# Patient Record
Sex: Female | Born: 1955 | Race: Black or African American | Hispanic: No | Marital: Married | Smoking: Never smoker
Health system: Southern US, Community
[De-identification: ages and names within clinical notes are randomized; demographics above are authoritative.]

## PROBLEM LIST (undated history)

## (undated) DIAGNOSIS — H409 Unspecified glaucoma: Secondary | ICD-10-CM

## (undated) DIAGNOSIS — M1990 Unspecified osteoarthritis, unspecified site: Secondary | ICD-10-CM

## (undated) DIAGNOSIS — B019 Varicella without complication: Secondary | ICD-10-CM

## (undated) DIAGNOSIS — R7303 Prediabetes: Secondary | ICD-10-CM

## (undated) DIAGNOSIS — R011 Cardiac murmur, unspecified: Secondary | ICD-10-CM

## (undated) DIAGNOSIS — I10 Essential (primary) hypertension: Secondary | ICD-10-CM

## (undated) DIAGNOSIS — M8580 Other specified disorders of bone density and structure, unspecified site: Secondary | ICD-10-CM

## (undated) DIAGNOSIS — J302 Other seasonal allergic rhinitis: Secondary | ICD-10-CM

## (undated) HISTORY — DX: Other seasonal allergic rhinitis: J30.2

## (undated) HISTORY — DX: Varicella without complication: B01.9

## (undated) HISTORY — DX: Unspecified glaucoma: H40.9

## (undated) HISTORY — DX: Essential (primary) hypertension: I10

## (undated) HISTORY — DX: Other specified disorders of bone density and structure, unspecified site: M85.80

## (undated) HISTORY — DX: Unspecified osteoarthritis, unspecified site: M19.90

## (undated) HISTORY — DX: Prediabetes: R73.03

## (undated) HISTORY — DX: Cardiac murmur, unspecified: R01.1

---
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
## 1998-04-29 ENCOUNTER — Other Ambulatory Visit: Admission: RE | Admit: 1998-04-29 | Discharge: 1998-04-29 | Payer: Self-pay | Admitting: Obstetrics and Gynecology

## 1998-06-17 ENCOUNTER — Ambulatory Visit (HOSPITAL_COMMUNITY): Admission: RE | Admit: 1998-06-17 | Discharge: 1998-06-17 | Payer: Self-pay | Admitting: Family Medicine

## 1998-10-15 ENCOUNTER — Other Ambulatory Visit: Admission: RE | Admit: 1998-10-15 | Discharge: 1998-10-15 | Payer: Self-pay | Admitting: Obstetrics and Gynecology

## 1999-05-20 ENCOUNTER — Other Ambulatory Visit: Admission: RE | Admit: 1999-05-20 | Discharge: 1999-05-20 | Payer: Self-pay | Admitting: Obstetrics and Gynecology

## 2000-06-06 ENCOUNTER — Other Ambulatory Visit: Admission: RE | Admit: 2000-06-06 | Discharge: 2000-06-06 | Payer: Self-pay | Admitting: Obstetrics and Gynecology

## 2001-05-15 ENCOUNTER — Encounter (INDEPENDENT_AMBULATORY_CARE_PROVIDER_SITE_OTHER): Payer: Self-pay | Admitting: Internal Medicine

## 2001-06-21 ENCOUNTER — Other Ambulatory Visit: Admission: RE | Admit: 2001-06-21 | Discharge: 2001-06-21 | Payer: Self-pay | Admitting: Obstetrics and Gynecology

## 2002-07-03 ENCOUNTER — Other Ambulatory Visit: Admission: RE | Admit: 2002-07-03 | Discharge: 2002-07-03 | Payer: Self-pay | Admitting: Obstetrics and Gynecology

## 2003-08-13 ENCOUNTER — Other Ambulatory Visit: Admission: RE | Admit: 2003-08-13 | Discharge: 2003-08-13 | Payer: Self-pay | Admitting: Obstetrics and Gynecology

## 2004-09-01 ENCOUNTER — Other Ambulatory Visit: Admission: RE | Admit: 2004-09-01 | Discharge: 2004-09-01 | Payer: Self-pay | Admitting: Obstetrics and Gynecology

## 2004-10-15 ENCOUNTER — Ambulatory Visit: Payer: Self-pay | Admitting: Family Medicine

## 2005-10-05 ENCOUNTER — Other Ambulatory Visit: Admission: RE | Admit: 2005-10-05 | Discharge: 2005-10-05 | Payer: Self-pay | Admitting: Obstetrics and Gynecology

## 2005-11-11 ENCOUNTER — Ambulatory Visit: Payer: Self-pay | Admitting: Family Medicine

## 2006-01-05 ENCOUNTER — Ambulatory Visit: Payer: Self-pay | Admitting: Family Medicine

## 2006-10-13 ENCOUNTER — Ambulatory Visit: Payer: Self-pay | Admitting: Family Medicine

## 2006-11-10 ENCOUNTER — Ambulatory Visit: Payer: Self-pay | Admitting: Gastroenterology

## 2006-12-02 ENCOUNTER — Ambulatory Visit: Payer: Self-pay | Admitting: Gastroenterology

## 2006-12-02 HISTORY — PX: COLONOSCOPY: SHX174

## 2007-10-25 ENCOUNTER — Encounter (INDEPENDENT_AMBULATORY_CARE_PROVIDER_SITE_OTHER): Payer: Self-pay | Admitting: Internal Medicine

## 2007-10-25 DIAGNOSIS — O99810 Abnormal glucose complicating pregnancy: Secondary | ICD-10-CM | POA: Insufficient documentation

## 2007-11-01 ENCOUNTER — Encounter (INDEPENDENT_AMBULATORY_CARE_PROVIDER_SITE_OTHER): Payer: Self-pay | Admitting: Internal Medicine

## 2007-11-01 ENCOUNTER — Ambulatory Visit: Payer: Self-pay | Admitting: Family Medicine

## 2007-11-03 LAB — CONVERTED CEMR LAB
CO2: 24 meq/L (ref 19–32)
Calcium: 9.3 mg/dL (ref 8.4–10.5)
Glucose, Bld: 92 mg/dL (ref 70–99)
Potassium: 4.3 meq/L (ref 3.5–5.3)
Sodium: 143 meq/L (ref 135–145)
Total CHOL/HDL Ratio: 3
VLDL: 11 mg/dL (ref 0–40)

## 2007-11-06 LAB — CONVERTED CEMR LAB: Vit D, 1,25-Dihydroxy: 53 (ref 30–89)

## 2008-11-01 ENCOUNTER — Ambulatory Visit: Payer: Self-pay | Admitting: Family Medicine

## 2008-11-01 DIAGNOSIS — J069 Acute upper respiratory infection, unspecified: Secondary | ICD-10-CM | POA: Insufficient documentation

## 2008-11-05 LAB — CONVERTED CEMR LAB
Basophils Absolute: 0.1 10*3/uL (ref 0.0–0.1)
Basophils Relative: 0.7 % (ref 0.0–3.0)
Calcium: 9 mg/dL (ref 8.4–10.5)
Cholesterol: 175 mg/dL (ref 0–200)
Creatinine, Ser: 0.7 mg/dL (ref 0.4–1.2)
Eosinophils Absolute: 0.1 10*3/uL (ref 0.0–0.7)
GFR calc non Af Amer: 93 mL/min
HCT: 36.9 % (ref 36.0–46.0)
Hemoglobin: 12.7 g/dL (ref 12.0–15.0)
MCHC: 34.3 g/dL (ref 30.0–36.0)
MCV: 89.8 fL (ref 78.0–100.0)
Neutro Abs: 6.2 10*3/uL (ref 1.4–7.7)
RBC: 4.11 M/uL (ref 3.87–5.11)

## 2009-11-12 ENCOUNTER — Telehealth (INDEPENDENT_AMBULATORY_CARE_PROVIDER_SITE_OTHER): Payer: Self-pay | Admitting: Internal Medicine

## 2011-02-24 ENCOUNTER — Other Ambulatory Visit: Payer: Self-pay | Admitting: Obstetrics and Gynecology

## 2011-02-24 DIAGNOSIS — N6310 Unspecified lump in the right breast, unspecified quadrant: Secondary | ICD-10-CM

## 2011-02-26 ENCOUNTER — Ambulatory Visit
Admission: RE | Admit: 2011-02-26 | Discharge: 2011-02-26 | Disposition: A | Discharge status: Home / Self Care | Payer: PRIVATE HEALTH INSURANCE | Source: Ambulatory Visit | Attending: Obstetrics and Gynecology | Admitting: Obstetrics and Gynecology

## 2011-02-26 DIAGNOSIS — N6310 Unspecified lump in the right breast, unspecified quadrant: Secondary | ICD-10-CM

## 2012-04-19 ENCOUNTER — Other Ambulatory Visit: Payer: Self-pay | Admitting: Obstetrics and Gynecology

## 2012-04-19 DIAGNOSIS — R928 Other abnormal and inconclusive findings on diagnostic imaging of breast: Secondary | ICD-10-CM

## 2012-05-01 ENCOUNTER — Ambulatory Visit
Admission: RE | Admit: 2012-05-01 | Discharge: 2012-05-01 | Disposition: A | Discharge status: Home / Self Care | Payer: PRIVATE HEALTH INSURANCE | Source: Ambulatory Visit | Attending: Obstetrics and Gynecology | Admitting: Obstetrics and Gynecology

## 2012-05-01 DIAGNOSIS — R928 Other abnormal and inconclusive findings on diagnostic imaging of breast: Secondary | ICD-10-CM

## 2015-08-20 ENCOUNTER — Ambulatory Visit
Admission: RE | Admit: 2015-08-20 | Discharge: 2015-08-20 | Disposition: A | Discharge status: Home / Self Care | Payer: PRIVATE HEALTH INSURANCE | Source: Ambulatory Visit | Attending: Primary Care | Admitting: Primary Care

## 2015-08-20 ENCOUNTER — Ambulatory Visit (INDEPENDENT_AMBULATORY_CARE_PROVIDER_SITE_OTHER)
Admission: RE | Admit: 2015-08-20 | Discharge: 2015-08-20 | Disposition: A | Discharge status: Home / Self Care | Payer: PRIVATE HEALTH INSURANCE | Source: Ambulatory Visit | Attending: Primary Care | Admitting: Primary Care

## 2015-08-20 ENCOUNTER — Encounter (INDEPENDENT_AMBULATORY_CARE_PROVIDER_SITE_OTHER): Payer: Self-pay

## 2015-08-20 ENCOUNTER — Encounter: Payer: Self-pay | Admitting: Primary Care

## 2015-08-20 ENCOUNTER — Ambulatory Visit (INDEPENDENT_AMBULATORY_CARE_PROVIDER_SITE_OTHER): Payer: PRIVATE HEALTH INSURANCE | Admitting: Primary Care

## 2015-08-20 VITALS — BP 148/102 | HR 78 | Temp 97.8°F | Ht 63.0 in | Wt 179.1 lb

## 2015-08-20 DIAGNOSIS — M2540 Effusion, unspecified joint: Secondary | ICD-10-CM | POA: Diagnosis not present

## 2015-08-20 DIAGNOSIS — H409 Unspecified glaucoma: Secondary | ICD-10-CM | POA: Insufficient documentation

## 2015-08-20 DIAGNOSIS — M2550 Pain in unspecified joint: Secondary | ICD-10-CM | POA: Diagnosis not present

## 2015-08-20 DIAGNOSIS — M25561 Pain in right knee: Secondary | ICD-10-CM | POA: Diagnosis not present

## 2015-08-20 DIAGNOSIS — M25562 Pain in left knee: Secondary | ICD-10-CM

## 2015-08-20 DIAGNOSIS — R030 Elevated blood-pressure reading, without diagnosis of hypertension: Secondary | ICD-10-CM

## 2015-08-20 DIAGNOSIS — I10 Essential (primary) hypertension: Secondary | ICD-10-CM | POA: Insufficient documentation

## 2015-08-20 DIAGNOSIS — IMO0001 Reserved for inherently not codable concepts without codable children: Secondary | ICD-10-CM

## 2015-08-20 LAB — RHEUMATOID FACTOR

## 2015-08-20 LAB — C-REACTIVE PROTEIN: CRP: 0.5 mg/dL (ref 0.5–20.0)

## 2015-08-20 LAB — BASIC METABOLIC PANEL
BUN: 11 mg/dL (ref 6–23)
CHLORIDE: 106 meq/L (ref 96–112)
CO2: 27 meq/L (ref 19–32)
CREATININE: 0.69 mg/dL (ref 0.40–1.20)
Calcium: 9.1 mg/dL (ref 8.4–10.5)
GFR: 112.01 mL/min (ref >60.00)
Glucose, Bld: 92 mg/dL (ref 70–99)
POTASSIUM: 3.9 meq/L (ref 3.5–5.1)
SODIUM: 140 meq/L (ref 135–145)

## 2015-08-20 MED ORDER — MELOXICAM 15 MG PO TABS
15.0000 mg | ORAL_TABLET | Freq: Every day | ORAL | Status: DC | PRN
Start: 2015-08-20 — End: 2017-08-31

## 2015-08-20 NOTE — Progress Notes (Signed)
Pre visit review using our clinic review tool, if applicable. No additional management support is needed unless otherwise documented below in the visit note. 

## 2015-08-20 NOTE — Patient Instructions (Signed)
Complete xray(s) prior to leaving today. I will contact you regarding your results.  Complete lab work prior to leaving today. I will notify you of your results.  Please schedule a physical with me in the next 1-2 months. You will also schedule a lab only appointment one week prior. We will discuss your lab results during your physical.  Check your blood pressure several times weekly for the next several weeks. Record your readings and bring them to your next appointment. Ensure you have rested for at least 30 minutes prior to measuring.  It was a pleasure to meet you today! Please don't hesitate to call me with any questions. Welcome to [facility]!  Arthritis Arthritis is a term that is commonly used to refer to joint pain or joint disease. There are more than 100 types of arthritis. CAUSES The most common cause of this condition is wear and tear of a joint. Other causes include:  Gout.  Inflammation of a joint.  An infection of a joint.  Sprains and other injuries near the joint.  A drug reaction or allergic reaction. In some cases, the cause may not be known. SYMPTOMS The main symptom of this condition is pain in the joint with movement. Other symptoms include:  Redness, swelling, or stiffness at a joint.  Warmth coming from the joint.  Fever.  Overall feeling of illness. DIAGNOSIS This condition may be diagnosed with a physical exam and tests, including:  Blood tests.  Urine tests.  Imaging tests, such as MRI, X-rays, or a CT scan. Sometimes, fluid is removed from a joint for testing. TREATMENT Treatment for this condition may involve:  Treatment of the cause, if it is known.  Rest.  Raising (elevating) the joint.  Applying cold or hot packs to the joint.  Medicines to improve symptoms and reduce inflammation.  Injections of a steroid such as cortisone into the joint to help reduce pain and inflammation. Depending on the cause of your arthritis, you may  need to make lifestyle changes to reduce stress on your joint. These changes may include exercising more and losing weight. HOME CARE INSTRUCTIONS Medicines  Take over-the-counter and prescription medicines only as told by your health care provider.  Do not take aspirin to relieve pain if gout is suspected. Activities  Rest your joint if told by your health care provider. Rest is important when your disease is active and your joint feels painful, swollen, or stiff.  Avoid activities that make the pain worse. It is important to balance activity with rest.  Exercise your joint regularly with range-of-motion exercises as told by your health care provider. Try doing low-impact exercise, such as:  Swimming.  Water aerobics.  Biking.  Walking. Joint Care  If your joint is swollen, keep it elevated if told by your health care provider.  If your joint feels stiff in the morning, try taking a warm shower.  If directed, apply heat to the joint. If you have diabetes, do not apply heat without permission from your health care provider.  Put a towel between the joint and the hot pack or heating pad.  Leave the heat on the area for 20-30 minutes.  If directed, apply ice to the joint:  Put ice in a plastic bag.  Place a towel between your skin and the bag.  Leave the ice on for 20 minutes, 2-3 times per day.  Keep all follow-up visits as told by your health care provider. This is important. SEEK MEDICAL CARE IF:  The pain gets worse.  You have a fever. SEEK IMMEDIATE MEDICAL CARE IF:  You develop severe joint pain, swelling, or redness.  Many joints become painful and swollen.  You develop severe back pain.  You develop severe weakness in your leg.  You cannot control your bladder or bowels.   This information is not intended to replace advice given to you by your health care provider. Make sure you discuss any questions you have with your health care provider.    Document Released: 12/09/2004 Document Revised: 07/23/2015 Document Reviewed: 01/27/2015 Elsevier Interactive Patient Education [employer].

## 2015-08-20 NOTE — Assessment & Plan Note (Signed)
Diagnosed 5 years ago. Managed on lumigan and timolol drops per [facility]. She follows with them semi-annually.

## 2015-08-20 NOTE — Progress Notes (Signed)
Subjective:    Patient ID: Madison [name], female    DOB: 07-18-1956, 59 y.o.   MRN: [id number]  HPI  Madison [name] is a 59 year old female who presents today to establish care and discuss the problems mentioned below. Will obtain old records. She's been using the [facility] for her care during the past several years. Her last full physical was several years ago.  1) Elevated Blood Pressure: Elevated in the clinic today at 148/102. She reports elevated readings in the the past (several years ago). She has not checked her BP recently. Denies headaches, chest pain, SOB.  2) Arthritis: Present to bilateral knees, hands, elbows. Her pain is worse in the morning when she first wakes up, and going down steps. She continues to experience pain  throughout the day. She's been taking ibuprofen intermittently with improvement. She wears orthotic supports and knee braces. She will notice joint swelling to her left ring finger over the past several months. Her knee pain has been present for at least one year.   3) Glaucoma: Diagnosed 5 years ago Currently managed on Lumigan 0.01% and timolol 0.25% solution. She is managed at [facility] every 6 months.   Review of Systems  Constitutional: Negative for unexpected weight change.  HENT: Negative for rhinorrhea.   Respiratory: Negative for cough and shortness of breath.   Cardiovascular: Negative for chest pain.  Gastrointestinal: Negative for diarrhea and constipation.  Genitourinary: Negative for difficulty urinating.  Musculoskeletal: Positive for arthralgias.  Skin: Negative for rash.  Allergic/Immunologic: Positive for environmental allergies.  Neurological: Negative for dizziness, numbness and headaches.  Psychiatric/Behavioral:       Denies concerns for anxieyt or depression       Past Medical History  Diagnosis Date  . Arthritis   . Chicken pox   . Glaucoma   . Allergy   . Heart murmur   . Hypertension   .  Osteopenia     Social History   Social History  . Marital Status: Married    Spouse Name: N/A  . Number of Children: N/A  . Years of Education: N/A   Occupational History  . Not on file.   Social History Main Topics  . Smoking status: Never Smoker   . Smokeless tobacco: Not on file  . Alcohol Use: No  . Drug Use: Not on file  . Sexual Activity: Not on file   Other Topics Concern  . Not on file   Social History Narrative  . No narrative on file    Past Surgical History  Procedure Laterality Date  . Tonsillectomy and adenoidectomy  1961    Family History  Problem Relation Age of Onset  . Arthritis Mother   . Hyperlipidemia Mother   . Heart disease Mother   . Stroke Mother   . Hypertension Mother   . Diabetes Father   . Diabetes Brother   . Arthritis Maternal Grandmother   . Diabetes Paternal Grandmother     No Known Allergies  No current outpatient prescriptions on file prior to visit.   No current facility-administered medications on file prior to visit.    BP 148/102 mmHg  Pulse 78  Temp(Src) 97.8 F (36.6 C) (Oral)  Ht 5\' 3"  (1.6 m)  Wt 179 lb 1.9 oz (81.248 kg)  BMI 31.74 kg/m2  SpO2 97%    Objective:   Physical Exam  Constitutional: She is oriented to person, place, and time. She appears well-nourished.  Neck:  Neck supple.  Cardiovascular: Normal rate and regular rhythm.   Pulmonary/Chest: Effort normal and breath sounds normal.  Musculoskeletal:       Right elbow: Tenderness found.       Right knee: She exhibits decreased range of motion. She exhibits no deformity and normal alignment. No tenderness found.       Left knee: She exhibits decreased range of motion. She exhibits no deformity and normal alignment. No tenderness found.  Stiffness to bilateral patella.  Neurological: She is alert and oriented to person, place, and time.  Skin: Skin is warm and dry.  Psychiatric: She has a normal mood and affect.          Assessment &  Plan:

## 2015-08-20 NOTE — Assessment & Plan Note (Signed)
Elevated during exam today. Endorses history several years ago for elevated readings. Currently not managed on medication. Discussed improvement through weight loss. She is to monitor her readings at home. Will continue to monitor.

## 2015-08-20 NOTE — Assessment & Plan Note (Addendum)
Suspect osteoarthritis as pain is worse in the morning with stiffness bilaterally. Exam with slight decrease in ROM due to stiffness. Will obtain xrays today. Will obtain blood work to rule out other inflammatory disorders. RX for Meloxicam to use PRN due to moderate intensity of pain.

## 2015-08-21 LAB — ANA: ANA: NEGATIVE

## 2016-05-13 ENCOUNTER — Other Ambulatory Visit: Payer: Self-pay | Admitting: Primary Care

## 2016-05-13 DIAGNOSIS — Z0000 Encounter for general adult medical examination without abnormal findings: Secondary | ICD-10-CM

## 2016-05-20 ENCOUNTER — Other Ambulatory Visit (INDEPENDENT_AMBULATORY_CARE_PROVIDER_SITE_OTHER): Payer: Managed Care, Other (non HMO)

## 2016-05-20 DIAGNOSIS — Z0000 Encounter for general adult medical examination without abnormal findings: Secondary | ICD-10-CM | POA: Diagnosis not present

## 2016-05-20 LAB — COMPREHENSIVE METABOLIC PANEL
ALT: 11 U/L (ref 0–35)
AST: 16 U/L (ref 0–37)
Albumin: 4.3 g/dL (ref 3.5–5.2)
Alkaline Phosphatase: 55 U/L (ref 39–117)
BUN: 12 mg/dL (ref 6–23)
CHLORIDE: 109 meq/L (ref 96–112)
CO2: 26 meq/L (ref 19–32)
Calcium: 9.5 mg/dL (ref 8.4–10.5)
Creatinine, Ser: 0.74 mg/dL (ref 0.40–1.20)
GFR: 103.06 mL/min (ref >60.00)
GLUCOSE: 103 mg/dL — AB (ref 70–99)
POTASSIUM: 4 meq/L (ref 3.5–5.1)
SODIUM: 141 meq/L (ref 135–145)
Total Bilirubin: 0.4 mg/dL (ref 0.2–1.2)
Total Protein: 7.4 g/dL (ref 6.0–8.3)

## 2016-05-20 LAB — LIPID PANEL
Cholesterol: 213 mg/dL — ABNORMAL HIGH (ref 0–200)
HDL: 56.6 mg/dL (ref >39.00)
LDL CALC: 143 mg/dL — AB (ref 0–99)
NONHDL: 156.56
Total CHOL/HDL Ratio: 4
Triglycerides: 68 mg/dL (ref 0.0–149.0)
VLDL: 13.6 mg/dL (ref 0.0–40.0)

## 2016-05-20 LAB — HEMOGLOBIN A1C: Hgb A1c MFr Bld: 5.6 % (ref 4.6–6.5)

## 2016-05-20 LAB — VITAMIN D 25 HYDROXY (VIT D DEFICIENCY, FRACTURES): VITD: 30.34 ng/mL (ref 30.00–100.00)

## 2016-05-25 ENCOUNTER — Ambulatory Visit (INDEPENDENT_AMBULATORY_CARE_PROVIDER_SITE_OTHER): Payer: Managed Care, Other (non HMO) | Admitting: Primary Care

## 2016-05-25 ENCOUNTER — Encounter: Payer: Self-pay | Admitting: Primary Care

## 2016-05-25 ENCOUNTER — Other Ambulatory Visit (HOSPITAL_COMMUNITY)
Admission: RE | Admit: 2016-05-25 | Discharge: 2016-05-25 | Disposition: A | Discharge status: Home / Self Care | Payer: Managed Care, Other (non HMO) | Source: Ambulatory Visit | Attending: Primary Care | Admitting: Primary Care

## 2016-05-25 VITALS — BP 118/82 | HR 60 | Temp 98.3°F | Ht 63.0 in | Wt 165.4 lb

## 2016-05-25 DIAGNOSIS — Z01419 Encounter for gynecological examination (general) (routine) without abnormal findings: Secondary | ICD-10-CM | POA: Insufficient documentation

## 2016-05-25 DIAGNOSIS — M8580 Other specified disorders of bone density and structure, unspecified site: Secondary | ICD-10-CM | POA: Insufficient documentation

## 2016-05-25 DIAGNOSIS — Z1239 Encounter for other screening for malignant neoplasm of breast: Secondary | ICD-10-CM | POA: Diagnosis not present

## 2016-05-25 DIAGNOSIS — M25561 Pain in right knee: Secondary | ICD-10-CM

## 2016-05-25 DIAGNOSIS — M25562 Pain in left knee: Secondary | ICD-10-CM

## 2016-05-25 DIAGNOSIS — R030 Elevated blood-pressure reading, without diagnosis of hypertension: Secondary | ICD-10-CM | POA: Diagnosis not present

## 2016-05-25 DIAGNOSIS — E785 Hyperlipidemia, unspecified: Secondary | ICD-10-CM

## 2016-05-25 DIAGNOSIS — Z124 Encounter for screening for malignant neoplasm of cervix: Secondary | ICD-10-CM

## 2016-05-25 DIAGNOSIS — Z0000 Encounter for general adult medical examination without abnormal findings: Secondary | ICD-10-CM

## 2016-05-25 DIAGNOSIS — IMO0001 Reserved for inherently not codable concepts without codable children: Secondary | ICD-10-CM

## 2016-05-25 DIAGNOSIS — Z1151 Encounter for screening for human papillomavirus (HPV): Secondary | ICD-10-CM | POA: Diagnosis not present

## 2016-05-25 DIAGNOSIS — Z23 Encounter for immunization: Secondary | ICD-10-CM

## 2016-05-25 DIAGNOSIS — H409 Unspecified glaucoma: Secondary | ICD-10-CM

## 2016-05-25 NOTE — Progress Notes (Signed)
Pre visit review using our clinic review tool, if applicable. No additional management support is needed unless otherwise documented below in the visit note. 

## 2016-05-25 NOTE — Assessment & Plan Note (Signed)
Managed by opthalmology semi-annually.

## 2016-05-25 NOTE — Assessment & Plan Note (Signed)
Continues to experience discomfort, some improvement with Meloxicam. She is interested in injections, discussed evaluation with Dr. Lorelei Pont would be beneficial, she will schedule.

## 2016-05-25 NOTE — Patient Instructions (Signed)
You will be contacted regarding your mammogram and bone density testing. Please let us know if you have not heard back within one week.   Start Vitamin D 1000 unit capsules. Take 1 capsule by mouth every day. Take this in addition to your calcium with vitamin D.  Schedule an appointment with Dr. Lorelei Pont for evaluation of your knee pain.  Congratulations on your weight loss and reduction in blood sugar levels!! This is a huge accomplishment!  You were provided with a Tetanus vaccination today which will cover you for 10 years. You will be due for your Shingles vaccination at age 60.  Your cholesterol is slightly elevated. Continue to make improvements in your diet and start exercising.  Ensure you are consuming 64 ounces of water daily.  You should be getting 1 hour of moderate intensity exercise 5 days weekly.  Follow up in 1 year for repeat physical or sooner if needed.  It was a pleasure to see you today!

## 2016-05-25 NOTE — Assessment & Plan Note (Signed)
Stable in the clinic today, home readings of 130/80's.

## 2016-05-25 NOTE — Addendum Note (Signed)
Addended by: Jacqualin Combes on: 05/25/2016 09:24 AM   Modules accepted: Orders, SmartSet

## 2016-05-25 NOTE — Assessment & Plan Note (Signed)
Td due, completed today. Shingles due next year. Mammogram, bone density, pap due. All ordered/completed today. She has lost 10+ pounds through improvements in diet, commended her on this accomplishment. Labs with much improved A1C from 6.5 to 5.6. Also with slight hyperlipidemia, discussed to incorporate exercise. Exam unremarkable.  Follow up in 1 year for repeat physical.

## 2016-05-25 NOTE — Assessment & Plan Note (Signed)
Bone density scanning due, ordered today. Previously managed on Fosamax, stopped taking due to possible side effects after reading the Internet. Discussed calcium with vitamin D treatment. Will continue to monitor.

## 2016-05-25 NOTE — Progress Notes (Signed)
Subjective:    Patient ID: Madison [name], female    DOB: 1956/02/21, 60 y.o.   MRN: [id number]  HPI  Madison [name] is a 60 year old female who presents today for complete physical.  Immunizations: -Tetanus: Completed in 2006 -Influenza: Did complete last Fall 2016 -Pneumonia: Completed Pneumovax in 2006  Diet: She endorses a healthy diet. Breakfast: Fiber one or granola bar Lunch: Research officer, trade [employer]: Vegetable, salad, protein Snacks: Nuts, cheese and crackers Desserts: 4 times weekly Beverages: Diet soda, some water intake  Exercise: She does not currently exercise, sometimes walks 2-3 times weekly. Eye exam: Completed in 2016, glaucoma. Dental exam: Completes semi-annually Colonoscopy: Completed at age 54. Due in 2018. Pap Smear: Completed 3 years ago. Due today. Mammogram: Completed 2 years ago. Due.  Wt Readings from Last 3 Encounters:  05/25/16 165 lb 6.4 oz (75.025 kg)  08/20/15 179 lb 1.9 oz (81.248 kg)  11/01/08 164 lb (74.39 kg)      Review of Systems  Constitutional: Negative for unexpected weight change.  HENT: Negative for rhinorrhea.   Respiratory: Negative for cough and shortness of breath.   Cardiovascular: Negative for chest pain.  Gastrointestinal: Negative for diarrhea and constipation.  Genitourinary: Negative for difficulty urinating and menstrual problem.  Musculoskeletal: Positive for arthralgias. Negative for myalgias.       Bilateral knees and left elbow  Skin: Negative for rash.  Allergic/Immunologic: Positive for environmental allergies.  Neurological: Negative for dizziness, numbness and headaches.  Psychiatric/Behavioral:       Denies concerns for anxiety or depression       Past Medical History  Diagnosis Date  . Arthritis   . Chicken pox   . Glaucoma   . Allergy   . Heart murmur   . Hypertension   . Osteopenia      Social History   Social History  . Marital Status: Married    Spouse Name: N/A  . Number of Children:  N/A  . Years of Education: N/A   Occupational History  . Not on file.   Social History Main Topics  . Smoking status: Never Smoker   . Smokeless tobacco: Not on file  . Alcohol Use: No  . Drug Use: Not on file  . Sexual Activity: Not on file   Other Topics Concern  . Not on file   Social History Narrative   Married.   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    Past Surgical History  Procedure Laterality Date  . Tonsillectomy and adenoidectomy  1961    Family History  Problem Relation Age of Onset  . Arthritis Mother   . Hyperlipidemia Mother   . Heart disease Mother   . Stroke Mother   . Hypertension Mother   . Diabetes Father   . Diabetes Brother   . Arthritis Maternal Grandmother   . Diabetes Paternal Grandmother     No Known Allergies  Current Outpatient Prescriptions on File Prior to Visit  Medication Sig Dispense Refill  . cetirizine (ZYRTEC) 10 MG tablet Take 10 mg by mouth daily.    Marland Kitchen LUMIGAN 0.01 % SOLN INSTILL ONE DROP INTO BOTH EYES AT BEDTIME  6  . timolol (TIMOPTIC) 0.25 % ophthalmic solution USE 1 DROP(S) IN BOTH EYES ONCE IN THE MORNING FOR 30 DAYS  6  . meloxicam (MOBIC) 15 MG tablet Take 1 tablet (15 mg total) by mouth daily as needed for pain. (Patient  not taking: Reported on 05/25/2016) 30 tablet 1   No current facility-administered medications on file prior to visit.    BP 118/82 mmHg  Pulse 60  Temp(Src) 98.3 F (36.8 C) (Oral)  Ht 5\' 3"  (1.6 m)  Wt 165 lb 6.4 oz (75.025 kg)  BMI 29.31 kg/m2  SpO2 98%    Objective:   Physical Exam  Constitutional: She is oriented to person, place, and time. She appears well-nourished.  HENT:  Right Ear: Tympanic membrane and ear canal normal.  Left Ear: Tympanic membrane and ear canal normal.  Nose: Nose normal.  Mouth/Throat: Oropharynx is clear and moist.  Eyes: Conjunctivae and EOM are normal. Pupils are equal, round, and reactive to  light.  Neck: Neck supple. No thyromegaly present.  Cardiovascular: Normal rate and regular rhythm.   No murmur heard. Pulmonary/Chest: Effort normal and breath sounds normal. She has no rales.  Abdominal: Soft. Bowel sounds are normal. There is no tenderness.  Genitourinary: There is no lesion on the right labia. There is no lesion on the left labia. Cervix exhibits no motion tenderness and no discharge. Right adnexum displays no tenderness. Left adnexum displays no tenderness. No tenderness in the vagina. No vaginal discharge found.  Musculoskeletal: Normal range of motion.  Lymphadenopathy:    She has no cervical adenopathy.  Neurological: She is alert and oriented to person, place, and time. She has normal reflexes. No cranial nerve deficit.  Skin: Skin is warm and dry. No rash noted.  Psychiatric: She has a normal mood and affect.          Assessment & Plan:

## 2016-05-25 NOTE — Assessment & Plan Note (Signed)
TC of 213, LDL of 143, Family history of hyperlipidemia. Recent weight loss of 10+ pounds through healthy lifestyle changes. Will continue to monitor lipids.

## 2016-05-27 ENCOUNTER — Encounter: Payer: Self-pay | Admitting: *Deleted

## 2016-05-27 LAB — CYTOLOGY - PAP

## 2016-06-03 ENCOUNTER — Encounter: Payer: Self-pay | Admitting: Family Medicine

## 2016-06-03 ENCOUNTER — Ambulatory Visit (INDEPENDENT_AMBULATORY_CARE_PROVIDER_SITE_OTHER): Payer: Managed Care, Other (non HMO) | Admitting: Family Medicine

## 2016-06-03 VITALS — BP 110/72 | HR 63 | Temp 98.3°F | Ht 63.0 in | Wt 164.5 lb

## 2016-06-03 DIAGNOSIS — M170 Bilateral primary osteoarthritis of knee: Secondary | ICD-10-CM | POA: Diagnosis not present

## 2016-06-03 DIAGNOSIS — M2240 Chondromalacia patellae, unspecified knee: Secondary | ICD-10-CM | POA: Diagnosis not present

## 2016-06-03 NOTE — Progress Notes (Signed)
Dr. Frederico Hamman T. Marin Milley, MD, [facility] [place], [address] Phone: [phone] Fax: [phone]  06/03/2016  Patient: Madison [name], MRN: [id number], DOB: 1956-09-24, 60 y.o.  Primary Physician:  Sheral Flow, NP   Chief Complaint  Patient presents with  . Knee Pain    Bilateral   Subjective:   This 60 y.o. female patient presents with 2 day h/o B sided knee pain after no injury. No audible pop was heard. The patient has not had an effusion. No symptomatic giving-way. No mechanical clicking. Joint has not locked up. Patient has been able to walk but is limping. The patient does have pain going up and down stairs or rising from a seated position.   R sided knee pain for a couple of years.  Works for [employer] in [place]. 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   Meetings, computers, etc with some walking.  Not too much exercise.   Meloxicam prn. Occ tylenol, advil, or alleve No ice.   Pain location: anterior Current physical activity: minimal Prior Knee Surgery: none Current pain meds: rare Mobic Bracing: none Occupation or school level: as above  The PMH, PSH, Social History, Family History, Medications, and allergies have been reviewed in [facility], and have been updated if relevant.  Patient Active Problem List   Diagnosis Date Noted  . Preventative health care 05/25/2016  . Hyperlipidemia 05/25/2016  . Osteopenia 05/25/2016  . Knee pain, bilateral 08/20/2015  . Elevated blood pressure 08/20/2015  . Glaucoma 08/20/2015    Past Medical History  Diagnosis Date  . Arthritis   . Chicken pox   . Glaucoma   . Seasonal allergies   . Heart murmur   . Hypertension   . Osteopenia   . Borderline diabetes     Past Surgical History  Procedure Laterality Date  . Tonsillectomy and adenoidectomy  1961    Social History   Social History  . Marital Status: Married    Spouse Name: N/A  . Number  of Children: N/A  . Years of Education: N/A   Occupational History  . Not on file.   Social History Main Topics  . Smoking status: Never Smoker   . Smokeless tobacco: Never Used  . Alcohol Use: No  . Drug Use: No  . Sexual Activity: Not on file   Other Topics Concern  . Not on file   Social History Narrative   Married.   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    Family History  Problem Relation Age of Onset  . Arthritis Mother   . Hyperlipidemia Mother   . Heart disease Mother   . Stroke Mother   . Hypertension Mother   . Diabetes Father   . Diabetes Brother   . Arthritis Maternal Grandmother   . Diabetes Paternal Grandmother     No Known Allergies  Medication list reviewed and updated in full in [place].  ROS: no acute illness or fever MSK: above GI: tol po intake without nausea or vomitting. Neuro: no numbness, tingling, or radiculopathy O/w per hpi  Objective:   Blood pressure 110/72, pulse 63, temperature 98.3 F (36.8 C), temperature source Oral, height 5\' 3"  (1.6 m), weight 164 lb 8 oz (74.617 kg).  GEN: Well-developed,well-nourished,in no acute distress; alert,appropriate and cooperative throughout examination HEENT: Normocephalic and atraumatic without obvious abnormalities. Ears, externally no deformities PULM: Breathing  comfortably in no respiratory distress EXT: No clubbing, cyanosis, or edema PSYCH: Normally interactive. Cooperative during the interview. Pleasant. Friendly and conversant. Not anxious or depressed appearing. Normal, full affect.  B knees Gait: Normal heel toe pattern ROM: WNL Effusion: neg Echymosis or edema: none Patellar tendon NT Painful PLICA: neg Patellar grind: POS Medial and lateral patellar facet loading: negative medial and lateral joint lines:NT Mcmurray's some pain Flexion-pinch mild pain Varus and valgus stress: stable Lachman: neg Ant and Post  drawer: neg Hip abduction, IR, ER: WNL Hip flexion str: 5/5 Hip abd: 5/5 Quad: 5/5 VMO atrophy:No Hamstring concentric and eccentric: 5/5  Radiology: CLINICAL DATA:  Bilateral knee pain   EXAM: LEFT KNEE 3 VIEWS   COMPARISON:  None.   FINDINGS: No acute fracture or dislocation is noted. Mild degenerative changes in the patellofemoral space are. No acute joint effusion is seen.   IMPRESSION: Degenerative change without acute abnormality.     Electronically Signed   By: Inez Catalina M.D.   On: 08/20/2015 12:04    CLINICAL DATA:  Knee pain, no known injury, initial encounter   EXAM: RIGHT KNEE 3 VIEWS   COMPARISON:  None.   FINDINGS: Mild degenerative changes are noted in the patellofemoral space. No significant joint effusion is seen. No acute fracture or dislocation is noted.   IMPRESSION: Degenerative change without acute abnormality.     Electronically Signed   By: Inez Catalina M.D.   On: 08/20/2015 12:05   Assessment and Plan:   Primary osteoarthritis of both knees  Chondromalacia patellae, unspecified laterality  >25 minutes spent in face to face time with patient, >50% spent in counselling or coordination of care   Primarily she is having patellofemoral symptoms with some grinding and patellofemoral arthritis. Reviewed that she needs to become more active, emphasizing quadricep and hip strengthening. Given a program from the [employer] of orthopedic surgery.  Also strongly recommended an upright bicycle. Recommended weight loss.  On bad days, NSAIDs are certainly reasonable along with Tylenol and ice. Anatomy was reviewed using an anatomical model.  Follow-up: prn  Signed,  Courtlyn Aki T. Omir Cooprider, MD   Patient's Medications  New Prescriptions   No medications on file  Previous Medications   CETIRIZINE (ZYRTEC) 10 MG TABLET    Take 10 mg by mouth daily.   LUMIGAN 0.01 % SOLN    INSTILL ONE DROP INTO BOTH EYES AT BEDTIME   MELOXICAM  (MOBIC) 15 MG TABLET    Take 1 tablet (15 mg total) by mouth daily as needed for pain.   TIMOLOL (TIMOPTIC) 0.25 % OPHTHALMIC SOLUTION    USE 1 DROP(S) IN BOTH EYES ONCE IN THE MORNING FOR 30 DAYS  Modified Medications   No medications on file  Discontinued Medications   No medications on file

## 2016-06-03 NOTE — Progress Notes (Signed)
Pre visit review using our clinic review tool, if applicable. No additional management support is needed unless otherwise documented below in the visit note. 

## 2016-06-09 ENCOUNTER — Ambulatory Visit
Admission: RE | Admit: 2016-06-09 | Discharge: 2016-06-09 | Disposition: A | Discharge status: Home / Self Care | Payer: Managed Care, Other (non HMO) | Source: Ambulatory Visit | Attending: Primary Care | Admitting: Primary Care

## 2016-06-09 DIAGNOSIS — Z1239 Encounter for other screening for malignant neoplasm of breast: Secondary | ICD-10-CM

## 2016-06-09 DIAGNOSIS — M8580 Other specified disorders of bone density and structure, unspecified site: Secondary | ICD-10-CM

## 2016-06-11 ENCOUNTER — Encounter: Payer: Self-pay | Admitting: *Deleted

## 2016-10-06 ENCOUNTER — Encounter: Payer: Self-pay | Admitting: Gastroenterology

## 2016-12-22 ENCOUNTER — Ambulatory Visit: Payer: Self-pay | Admitting: Physician Assistant

## 2016-12-22 VITALS — BP 130/79 | HR 80 | Temp 98.1°F

## 2016-12-22 DIAGNOSIS — J101 Influenza due to other identified influenza virus with other respiratory manifestations: Secondary | ICD-10-CM

## 2016-12-22 LAB — POCT INFLUENZA A/B
INFLUENZA B, POC: NEGATIVE
Influenza A, POC: POSITIVE — AB

## 2016-12-22 MED ORDER — OSELTAMIVIR PHOSPHATE 75 MG PO CAPS: 75.0000 mg | ORAL_CAPSULE | Freq: Two times a day (BID) | ORAL | 0 refills | Status: DC

## 2016-12-22 NOTE — Progress Notes (Signed)
S: C/o runny nose and congestion with dry cough for 3 days, + fever, chills on Sunday, none since, denies cp/sob, v/d; mucus was green this am but clear throughout the day, cough is sporadic, + body aches and headache  Using otc meds: robitussin  O: PE: vitals wnl, nad,  perrl eomi, normocephalic, tms dull, nasal mucosa red and swollen, throat injected, neck supple no lymph, lungs c t a, cv rrr, neuro intact, flu swab + A  A:  Acute influenza A   P: drink fluids, continue regular meds , use otc meds of choice, return if not improving in 5 days, return earlier if worsening , tamiflu 75 mg bid x 5d

## 2017-08-31 ENCOUNTER — Ambulatory Visit: Payer: Self-pay | Admitting: Physician Assistant

## 2017-08-31 VITALS — BP 160/110 | HR 64 | Temp 98.5°F | Resp 16

## 2017-08-31 DIAGNOSIS — I10 Essential (primary) hypertension: Secondary | ICD-10-CM

## 2017-08-31 MED ORDER — LOSARTAN POTASSIUM 25 MG PO TABS: 25.0000 mg | ORAL_TABLET | Freq: Every day | ORAL | 0 refills | Status: DC

## 2017-08-31 NOTE — Progress Notes (Signed)
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

## 2017-09-07 ENCOUNTER — Ambulatory Visit: Payer: Self-pay | Admitting: Physician Assistant

## 2017-09-07 ENCOUNTER — Encounter: Payer: Self-pay | Admitting: Physician Assistant

## 2017-09-07 VITALS — BP 140/80 | HR 63 | Temp 98.5°F | Resp 16 | Ht 63.0 in | Wt 163.0 lb

## 2017-09-07 DIAGNOSIS — Z0189 Encounter for other specified special examinations: Secondary | ICD-10-CM

## 2017-09-07 DIAGNOSIS — Z299 Encounter for prophylactic measures, unspecified: Secondary | ICD-10-CM

## 2017-09-07 DIAGNOSIS — Z008 Encounter for other general examination: Secondary | ICD-10-CM

## 2017-09-07 DIAGNOSIS — Z0000 Encounter for general adult medical examination without abnormal findings: Secondary | ICD-10-CM

## 2017-09-07 NOTE — Progress Notes (Signed)
S: pt here for wellness physical and biometrics for insurance purposes, no complaints ros neg. PMH:   Glaucoma, osteopenia,  and htn  Social:  Nonsmoker Fam: as noted on chart  O: vitals wnl, nad, ENT wnl, neck supple no lymph, lungs c t a, cv rrr, abd soft nontender bs normal all 4 quads  A: wellness, biometric physical  P: screening mammogram, bone density, exec panel, vit d, continue bp meds, recheck bp in 2 weeks

## 2017-09-08 ENCOUNTER — Other Ambulatory Visit: Payer: Self-pay | Admitting: Physician Assistant

## 2017-09-08 LAB — CMP12+LP+TP+TSH+6AC+CBC/D/PLT
ALT: 10 IU/L (ref 0–32)
AST: 17 IU/L (ref 0–40)
Albumin/Globulin Ratio: 1.6 (ref 1.2–2.2)
Albumin: 4.3 g/dL (ref 3.6–4.8)
Alkaline Phosphatase: 67 IU/L (ref 39–117)
BASOS: 0 %
BILIRUBIN TOTAL: 0.3 mg/dL (ref 0.0–1.2)
BUN / CREAT RATIO: 14 (ref 12–28)
BUN: 9 mg/dL (ref 8–27)
Basophils Absolute: 0 10*3/uL (ref 0.0–0.2)
CHLORIDE: 106 mmol/L (ref 96–106)
CREATININE: 0.65 mg/dL (ref 0.57–1.00)
Calcium: 9.2 mg/dL (ref 8.7–10.3)
Chol/HDL Ratio: 3.5 ratio (ref 0.0–4.4)
Cholesterol, Total: 212 mg/dL — ABNORMAL HIGH (ref 100–199)
EOS (ABSOLUTE): 0.1 10*3/uL (ref 0.0–0.4)
EOS: 1 %
ESTIMATED CHD RISK: 0.6 times avg. (ref 0.0–1.0)
Free Thyroxine Index: 1.7 (ref 1.2–4.9)
GFR calc Af Amer: 111 mL/min/{1.73_m2} (ref >59)
GFR, EST NON AFRICAN AMERICAN: 96 mL/min/{1.73_m2} (ref >59)
GGT: 19 IU/L (ref 0–60)
GLUCOSE: 94 mg/dL (ref 65–99)
Globulin, Total: 2.7 g/dL (ref 1.5–4.5)
HDL: 61 mg/dL (ref >39)
HEMATOCRIT: 40.6 % (ref 34.0–46.6)
HEMOGLOBIN: 12.8 g/dL (ref 11.1–15.9)
IMMATURE GRANULOCYTES: 0 %
IRON: 79 ug/dL (ref 27–139)
Immature Grans (Abs): 0 10*3/uL (ref 0.0–0.1)
LDH: 164 IU/L (ref 119–226)
LDL Calculated: 134 mg/dL — ABNORMAL HIGH (ref 0–99)
LYMPHS ABS: 1.3 10*3/uL (ref 0.7–3.1)
Lymphs: 22 %
MCH: 29.4 pg (ref 26.6–33.0)
MCHC: 31.5 g/dL (ref 31.5–35.7)
MCV: 93 fL (ref 79–97)
Monocytes Absolute: 0.4 10*3/uL (ref 0.1–0.9)
Monocytes: 7 %
NEUTROS ABS: 4.1 10*3/uL (ref 1.4–7.0)
Neutrophils: 70 %
PHOSPHORUS: 3.6 mg/dL (ref 2.5–4.5)
PLATELETS: 205 10*3/uL (ref 150–379)
Potassium: 4.3 mmol/L (ref 3.5–5.2)
RBC: 4.35 x10E6/uL (ref 3.77–5.28)
RDW: 15.1 % (ref 12.3–15.4)
Sodium: 142 mmol/L (ref 134–144)
T3 UPTAKE RATIO: 23 % — AB (ref 24–39)
T4, Total: 7.3 ug/dL (ref 4.5–12.0)
TOTAL PROTEIN: 7 g/dL (ref 6.0–8.5)
TSH: 2.86 u[IU]/mL (ref 0.450–4.500)
Triglycerides: 83 mg/dL (ref 0–149)
URIC ACID: 4.6 mg/dL (ref 2.5–7.1)
VLDL CHOLESTEROL CAL: 17 mg/dL (ref 5–40)
WBC: 5.9 10*3/uL (ref 3.4–10.8)

## 2017-09-08 LAB — VITAMIN D 25 HYDROXY (VIT D DEFICIENCY, FRACTURES): VIT D 25 HYDROXY: 19.2 ng/mL — AB (ref 30.0–100.0)

## 2017-09-08 LAB — HCV COMMENT:

## 2017-09-08 LAB — HIV ANTIBODY (ROUTINE TESTING W REFLEX): HIV SCREEN 4TH GENERATION: NONREACTIVE

## 2017-09-08 LAB — HEPATITIS C ANTIBODY (REFLEX)

## 2017-09-08 MED ORDER — VITAMIN D (ERGOCALCIFEROL) 1.25 MG (50000 UNIT) PO CAPS: 50000.0000 [IU] | ORAL_CAPSULE | ORAL | 3 refills | Status: DC

## 2017-09-08 NOTE — Progress Notes (Unsigned)
[facility]

## 2017-09-27 ENCOUNTER — Other Ambulatory Visit: Payer: Self-pay | Admitting: Physician Assistant

## 2017-09-28 ENCOUNTER — Telehealth: Payer: Self-pay | Admitting: Emergency Medicine

## 2017-09-28 NOTE — Telephone Encounter (Signed)
Patient called with the latest reading of her blood pressure.  [facility]  142/84.  Wants to know if she need to do anything further.

## 2017-09-28 NOTE — Telephone Encounter (Signed)
Stay on the same medication for now

## 2017-10-31 ENCOUNTER — Ambulatory Visit
Admission: RE | Admit: 2017-10-31 | Discharge: 2017-10-31 | Disposition: A | Discharge status: Home / Self Care | Payer: Managed Care, Other (non HMO) | Source: Ambulatory Visit | Attending: Physician Assistant | Admitting: Physician Assistant

## 2017-10-31 DIAGNOSIS — Z1231 Encounter for screening mammogram for malignant neoplasm of breast: Secondary | ICD-10-CM | POA: Diagnosis not present

## 2017-10-31 DIAGNOSIS — M85851 Other specified disorders of bone density and structure, right thigh: Secondary | ICD-10-CM | POA: Diagnosis not present

## 2017-10-31 DIAGNOSIS — R928 Other abnormal and inconclusive findings on diagnostic imaging of breast: Secondary | ICD-10-CM | POA: Insufficient documentation

## 2017-10-31 DIAGNOSIS — Z299 Encounter for prophylactic measures, unspecified: Secondary | ICD-10-CM | POA: Insufficient documentation

## 2017-11-03 ENCOUNTER — Other Ambulatory Visit: Payer: Self-pay | Admitting: Physician Assistant

## 2017-11-03 DIAGNOSIS — R928 Other abnormal and inconclusive findings on diagnostic imaging of breast: Secondary | ICD-10-CM

## 2017-11-10 ENCOUNTER — Ambulatory Visit
Admission: RE | Admit: 2017-11-10 | Discharge: 2017-11-10 | Disposition: A | Discharge status: Home / Self Care | Payer: Managed Care, Other (non HMO) | Source: Ambulatory Visit | Attending: Physician Assistant | Admitting: Physician Assistant

## 2017-11-10 ENCOUNTER — Other Ambulatory Visit: Payer: Self-pay | Admitting: Physician Assistant

## 2017-11-10 DIAGNOSIS — N6489 Other specified disorders of breast: Secondary | ICD-10-CM | POA: Insufficient documentation

## 2017-11-10 DIAGNOSIS — R928 Other abnormal and inconclusive findings on diagnostic imaging of breast: Secondary | ICD-10-CM

## 2017-11-23 ENCOUNTER — Ambulatory Visit: Payer: Self-pay | Admitting: General Surgery

## 2017-11-24 ENCOUNTER — Ambulatory Visit
Admission: RE | Admit: 2017-11-24 | Discharge: 2017-11-24 | Disposition: A | Discharge status: Home / Self Care | Payer: Managed Care, Other (non HMO) | Source: Ambulatory Visit | Attending: Physician Assistant | Admitting: Physician Assistant

## 2017-11-24 DIAGNOSIS — N62 Hypertrophy of breast: Secondary | ICD-10-CM | POA: Insufficient documentation

## 2017-11-24 DIAGNOSIS — R928 Other abnormal and inconclusive findings on diagnostic imaging of breast: Secondary | ICD-10-CM

## 2017-11-24 DIAGNOSIS — R920 Mammographic microcalcification found on diagnostic imaging of breast: Secondary | ICD-10-CM | POA: Diagnosis not present

## 2017-11-24 HISTORY — PX: BREAST BIOPSY: SHX20

## 2017-11-25 LAB — SURGICAL PATHOLOGY

## 2017-12-19 ENCOUNTER — Encounter: Payer: Self-pay | Admitting: Obstetrics and Gynecology

## 2017-12-19 ENCOUNTER — Ambulatory Visit (INDEPENDENT_AMBULATORY_CARE_PROVIDER_SITE_OTHER): Payer: Managed Care, Other (non HMO) | Admitting: Obstetrics and Gynecology

## 2017-12-19 ENCOUNTER — Other Ambulatory Visit: Payer: Self-pay

## 2017-12-19 VITALS — BP 142/82 | Ht 63.0 in | Wt 166.0 lb

## 2017-12-19 DIAGNOSIS — Z1151 Encounter for screening for human papillomavirus (HPV): Secondary | ICD-10-CM

## 2017-12-19 DIAGNOSIS — Z124 Encounter for screening for malignant neoplasm of cervix: Secondary | ICD-10-CM | POA: Diagnosis not present

## 2017-12-19 DIAGNOSIS — Z01419 Encounter for gynecological examination (general) (routine) without abnormal findings: Secondary | ICD-10-CM

## 2017-12-19 NOTE — Progress Notes (Signed)
Presents for AEX/PAP 

## 2017-12-19 NOTE — Progress Notes (Signed)
Subjective:     Madison [name] is a 62 y.o. female postmenopausal for 10 years with BMI 29 who is here for a comprehensive physical exam. The patient reports no problems. She denies any vaginal bleeding or pelvic pain. She is sexually active without concerns for STD exposure. She denies any urinary incontinence. Patient is without any complaints  Past Medical History:  Diagnosis Date  . Arthritis   . Borderline diabetes   . Chicken pox   . Glaucoma   . Heart murmur   . Hypertension   . Osteopenia   . Seasonal allergies    Past Surgical History:  Procedure Laterality Date  . BREAST BIOPSY Right 11/24/2017   right breast stereo path pending  . TONSILLECTOMY AND ADENOIDECTOMY  1961   Family History  Problem Relation Age of Onset  . Arthritis Mother   . Hyperlipidemia Mother   . Heart disease Mother   . Stroke Mother   . Hypertension Mother   . Diabetes Father   . Diabetes Brother   . Arthritis Maternal Grandmother   . Diabetes Paternal Grandmother     Social History   Socioeconomic History  . Marital status: Married    Spouse name: Not on file  . Number of children: Not on file  . Years of education: Not on file  . Highest education level: Not on file  Social Needs  . Financial resource strain: Not on file  . Food insecurity - worry: Not on file  . Food insecurity - inability: Not on file  . Transportation needs - medical: Not on file  . Transportation needs - non-medical: Not on file  Occupational History  . Not on file  Tobacco Use  . Smoking status: Never Smoker  . Smokeless tobacco: Never Used  Substance and Sexual Activity  . Alcohol use: No    Alcohol/week: 0.0 oz  . Drug use: No  . Sexual activity: Not on file  Other Topics Concern  . Not on file  Social History Narrative   Married.   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   Health Maintenance  Topic Date Due  . COLONOSCOPY  12/02/2016  .  INFLUENZA VACCINE  06/15/2017  . PAP SMEAR  05/26/2019  . MAMMOGRAM  11/01/2019  . TETANUS/TDAP  05/25/2026  . Hepatitis C Screening  Completed  . HIV Screening  Completed       Review of Systems Pertinent items are noted in HPI.   Objective:  Blood pressure (!) 142/82, height 5\' 3"  (1.6 m), weight 166 lb (75.3 kg).     GENERAL: Well-developed, well-nourished female in no acute distress.  HEENT: Normocephalic, atraumatic. Sclerae anicteric.  NECK: Supple. Normal thyroid.  LUNGS: Clear to auscultation bilaterally.  HEART: Regular rate and rhythm. BREASTS: Symmetric in size. No palpable masses or lymphadenopathy, skin changes, or nipple drainage. ABDOMEN: Soft, nontender, nondistended. No organomegaly. PELVIC: Normal external female genitalia. Vagina is pink and rugated.  Normal discharge. Normal appearing cervix. Uterus is normal in size. No adnexal mass or tenderness. EXTREMITIES: No cyanosis, clubbing, or edema, 2+ distal pulses.    Assessment:    Healthy female exam.      Plan:    Pap smear collected  Patient with abnormal screening mammogram in December with biopsy recommended surgical excision. Patient was seen by breast surgeon with plans to follow up in 6 months Patient has had her bone density scan and is sue for her  colonoscopy. She will follow up with PCP Patient will be contacted with abnormal results See After Visit Summary for Counseling Recommendations

## 2017-12-22 LAB — CYTOLOGY - PAP
Diagnosis: NEGATIVE
HPV: DETECTED — AB

## 2018-01-25 ENCOUNTER — Encounter: Payer: Managed Care, Other (non HMO) | Admitting: Family Medicine

## 2018-02-28 ENCOUNTER — Encounter: Payer: Self-pay | Admitting: Primary Care

## 2018-02-28 ENCOUNTER — Ambulatory Visit (INDEPENDENT_AMBULATORY_CARE_PROVIDER_SITE_OTHER): Payer: Managed Care, Other (non HMO) | Admitting: Primary Care

## 2018-02-28 VITALS — BP 132/82 | HR 62 | Temp 97.8°F | Ht 63.0 in | Wt 162.2 lb

## 2018-02-28 DIAGNOSIS — Z0000 Encounter for general adult medical examination without abnormal findings: Secondary | ICD-10-CM

## 2018-02-28 DIAGNOSIS — M8580 Other specified disorders of bone density and structure, unspecified site: Secondary | ICD-10-CM | POA: Diagnosis not present

## 2018-02-28 DIAGNOSIS — Z23 Encounter for immunization: Secondary | ICD-10-CM

## 2018-02-28 DIAGNOSIS — E782 Mixed hyperlipidemia: Secondary | ICD-10-CM

## 2018-02-28 DIAGNOSIS — I10 Essential (primary) hypertension: Secondary | ICD-10-CM | POA: Diagnosis not present

## 2018-02-28 DIAGNOSIS — E559 Vitamin D deficiency, unspecified: Secondary | ICD-10-CM | POA: Diagnosis not present

## 2018-02-28 LAB — LIPID PANEL
Cholesterol: 196 mg/dL (ref 0–200)
HDL: 50.8 mg/dL (ref >39.00)
LDL CALC: 132 mg/dL — AB (ref 0–99)
NonHDL: 145.21
TRIGLYCERIDES: 67 mg/dL (ref 0.0–149.0)
Total CHOL/HDL Ratio: 4
VLDL: 13.4 mg/dL (ref 0.0–40.0)

## 2018-02-28 LAB — VITAMIN D 25 HYDROXY (VIT D DEFICIENCY, FRACTURES): VITD: 52.97 ng/mL (ref 30.00–100.00)

## 2018-02-28 MED ORDER — ZOSTER VAC RECOMB ADJUVANTED 50 MCG/0.5ML IM SUSR: 0.5000 mL | Freq: Once | INTRAMUSCULAR | 1 refills | Status: AC

## 2018-02-28 MED ORDER — ZOSTER VAC RECOMB ADJUVANTED 50 MCG/0.5ML IM SUSR: 0.5000 mL | Freq: Once | INTRAMUSCULAR | 0 refills | Status: DC

## 2018-02-28 NOTE — Assessment & Plan Note (Signed)
Immunizations UTD. Rx for Shingrix printed. Pap smear UTD per patient. Mammogram UTD, will undergo re-evaluation this Summer per general surgery. Discussed the importance of a healthy diet and regular exercise in order for weight loss, and to reduce the risk of any potential medical problems. Exam unremarkable. Labs with borderline hyperlipidemia. Labs from October 2018 reviewed. Follow up in 1 year for CPE.

## 2018-02-28 NOTE — Assessment & Plan Note (Signed)
Compliant to calcium and vitamin d. Repeat bone density scan in 2020.

## 2018-02-28 NOTE — Assessment & Plan Note (Addendum)
Stable in the office today, continue Losartan 25 mg. BMP unremarkable in October 2018.

## 2018-02-28 NOTE — Assessment & Plan Note (Signed)
Borderline high. Encouraged her to work on diet and start exercising. Continue to monitor.   The 10-year ASCVD risk score Mikey Bussing DC Brooke Bonito., et al., 2013) is: 8.1%   Values used to calculate the score:     Age: 62 years     Sex: Female     Is Non-Hispanic African American: Yes     Diabetic: No     Tobacco smoker: No     Systolic Blood Pressure: 785 mmHg     Is BP treated: Yes     HDL Cholesterol: 50.8 mg/dL     Total Cholesterol: 196 mg/dL

## 2018-02-28 NOTE — Progress Notes (Signed)
Subjective:    Patient ID: Madison [name], female    DOB: 04/19/1956, 62 y.o.   MRN: [id number]  HPI  Madison [name] is a 62 year old female who presents today for complete physical.  Immunizations: -Tetanus: Completed in 2017 -Pneumonia: Completed pneumovax in 2006 -Shingles: Never completed   Diet: She endorse a fair diet Breakfast: Granola bar, fruit, bacon Lunch: Lean Cuisine, [employer] Meal, sometimes fast food Dinner: Salad, [place] burger, potatoes, chicken tenders, vegetables  Snacks: Candy, peanut butter crackers Desserts: Daily hard candy daily Beverages: 2 glasses of water daily, diet soda, occasional juice/sweet tea  Exercise: She is not currently exercising Eye exam: Completed in January 2019 Dental exam: Completes annually  Colonoscopy: Completed in 2008, due.  Dexa: Completed in 2018, osteopenia. Due in 2020. Pap Smear: Completed in 2019 per GYN, negative Mammogram: Completed in January 2019, underwent biopsy revealing fibrocystic change with microcalcification. Pseudo-angiomatous stromal hyperplasia. She is due to see general surgery in June/July 2019 Hep C Screen: Negative in 2018    Review of Systems  Constitutional: Negative for unexpected weight change.  HENT: Negative for rhinorrhea.   Respiratory: Negative for cough and shortness of breath.   Cardiovascular: Negative for chest pain.  Gastrointestinal: Negative for constipation and diarrhea.  Genitourinary: Negative for difficulty urinating and menstrual problem.  Musculoskeletal: Negative for arthralgias and myalgias.  Skin: Negative for rash.  Allergic/Immunologic: Negative for environmental allergies.  Neurological: Negative for dizziness, numbness and headaches.  Psychiatric/Behavioral: The patient is not nervous/anxious.        Past Medical History:  Diagnosis Date  . Arthritis   . Borderline diabetes   . Chicken pox   . Glaucoma   . Heart murmur   . Hypertension   . Osteopenia   . Seasonal  allergies      Social History   Socioeconomic History  . Marital status: Married    Spouse name: Not on file  . Number of children: Not on file  . Years of education: Not on file  . Highest education level: Not on file  Occupational History  . Not on file  Social Needs  . Financial resource strain: Not on file  . Food insecurity:    Worry: Not on file    Inability: Not on file  . Transportation needs:    Medical: Not on file    Non-medical: Not on file  Tobacco Use  . Smoking status: Never Smoker  . Smokeless tobacco: Never Used  Substance and Sexual Activity  . Alcohol use: No    Alcohol/week: 0.0 oz  . Drug use: No  . Sexual activity: Not on file  Lifestyle  . Physical activity:    Days per week: Not on file    Minutes per session: Not on file  . Stress: Not on file  Relationships  . Social connections:    Talks on phone: Not on file    Gets together: Not on file    Attends religious service: Not on file    Active member of club or organization: Not on file    Attends meetings of clubs or organizations: Not on file    Relationship status: Not on file  . Intimate partner violence:    Fear of current or ex partner: Not on file    Emotionally abused: Not on file    Physically abused: Not on file    Forced sexual activity: Not on file  Other Topics Concern  . Not on file  Social  History Narrative   Married.   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    Past Surgical History:  Procedure Laterality Date  . BREAST BIOPSY Right 11/24/2017   right breast stereo path pending  . TONSILLECTOMY AND ADENOIDECTOMY  1961    Family History  Problem Relation Age of Onset  . Arthritis Mother   . Hyperlipidemia Mother   . Heart disease Mother   . Stroke Mother   . Hypertension Mother   . Diabetes Father   . Diabetes Brother   . Arthritis Maternal Grandmother   . Diabetes Paternal Grandmother     No Known  Allergies  Current Outpatient Medications on File Prior to Visit  Medication Sig Dispense Refill  . cetirizine (ZYRTEC) 10 MG tablet Take 10 mg by mouth daily.    Marland Kitchen losartan (COZAAR) 25 MG tablet TAKE 1 TABLET BY MOUTH EVERY DAY 90 tablet 3  . LUMIGAN 0.01 % SOLN INSTILL ONE DROP INTO BOTH EYES AT BEDTIME  6  . timolol (TIMOPTIC) 0.25 % ophthalmic solution USE 1 DROP(S) IN BOTH EYES ONCE IN THE MORNING FOR 30 DAYS  6  . Vitamin D, Ergocalciferol, (DRISDOL) 50000 units CAPS capsule Take 1 capsule (50,000 Units total) by mouth every 7 (seven) days. 12 capsule 3   No current facility-administered medications on file prior to visit.     BP 132/82   Pulse 62   Temp 97.8 F (36.6 C) (Oral)   Ht 5\' 3"  (1.6 m)   Wt 162 lb 4 oz (73.6 kg)   SpO2 98%   BMI 28.74 kg/m    Objective:   Physical Exam  Constitutional: She is oriented to person, place, and time. She appears well-nourished.  HENT:  Right Ear: Tympanic membrane and ear canal normal.  Left Ear: Tympanic membrane and ear canal normal.  Nose: Nose normal.  Mouth/Throat: Oropharynx is clear and moist.  Eyes: Pupils are equal, round, and reactive to light. Conjunctivae and EOM are normal.  Neck: Neck supple. No thyromegaly present.  Cardiovascular: Normal rate and regular rhythm.  No murmur heard. Pulmonary/Chest: Effort normal and breath sounds normal. She has no rales.  Abdominal: Soft. Bowel sounds are normal. There is no tenderness.  Musculoskeletal: Normal range of motion.  Lymphadenopathy:    She has no cervical adenopathy.  Neurological: She is alert and oriented to person, place, and time. She has normal reflexes. No cranial nerve deficit.  Skin: Skin is warm and dry. No rash noted.  Psychiatric: She has a normal mood and affect.          Assessment & Plan:

## 2018-02-28 NOTE — Assessment & Plan Note (Signed)
Following with ophthalmology. 

## 2018-02-28 NOTE — Patient Instructions (Addendum)
Stop by the lab prior to leaving today. I will notify you of your results once received.   Start exercising. You should be getting 150 minutes of moderate intensity exercise weekly.  Ensure you are consuming 64 ounces of water daily.  Increase consumption of vegetables, fruit, whole grains, lean protein.  Call the surgeon to discuss your gynecologist's concerns.   Continue taking Calcium with Vitamin D as prescribed.    Take the shingles vaccination to your pharmacy.   Try switching from Zyrtec to Xyzal.   Nasal Congestion/Ear Pressure: Try using Flonase (fluticasone) nasal spray. Instill 1 spray in each nostril twice daily.   Follow up in 1 year for your annual exam or sooner if needed  It was a pleasure to see you today!

## 2018-03-02 ENCOUNTER — Encounter: Payer: Self-pay | Admitting: *Deleted

## 2018-04-23 ENCOUNTER — Other Ambulatory Visit: Payer: Self-pay | Admitting: Surgery

## 2018-04-23 DIAGNOSIS — N6489 Other specified disorders of breast: Secondary | ICD-10-CM

## 2018-05-04 ENCOUNTER — Ambulatory Visit: Payer: Self-pay

## 2018-05-04 NOTE — Telephone Encounter (Signed)
Pt. Reported "headache and not feeling quite right" on Monday, 6/17 while at work.  Had nurse check BP; 150/90.  Has not had BP checked since.  Reported on Tuesday, she felt better.  Today stated she is "feeling okay, but kind of headachy."  I was able to come to work.  Reported rec'd Cortisone injections in her knees last Wed., 6/12, and questioned if this could have contributed to her not feeling as well.  Denied dizziness, blurred vision, chest pain, shortness of breath, or weakness.  Stated she has allergies, and takes Xyzal for these.  Denied feeling that her allergies are acting up at this time.  Advised to continue to monitor her BP, and if elevated, to try to get 2 readings at least 5-10 minutes apart, for confirmation that the her BP is elevated.  Encouraged to monitor daily BP and keep a log.  Appt. sched. for f/u with PCP on 6/24.  Care advice given per protocol.  Verb. understanding, and agrees with plan.       Reason for Disposition . [4] Systolic BP  >= 818 OR Diastolic >= 90 AND [5] taking BP medications  Answer Assessment - Initial Assessment Questions 1. BLOOD PRESSURE: "What is the blood pressure?" "Did you take at least two measurements 5 minutes apart?"     BP 150/90 on Monday, 6/17 2. ONSET: "When did you take your blood pressure?"     In afternoon on Monday  3. HOW: "How did you obtain the blood pressure?" (e.g., visiting nurse, automatic home BP monitor)     Nurse at work  4. HISTORY: "Do you have a history of high blood pressure?"     Yes; on medication for about one year 5. MEDICATIONS: "Are you taking any medications for blood pressure?" "Have you missed any doses recently?"     Denied missing any medications   6. OTHER SYMPTOMS: "Do you have any symptoms?" (e.g., headache, chest pain, blurred vision, difficulty breathing, weakness)     C/o headache; denied dizziness, change in vision, chest pain, shortness of breath, fever/ chills, or weakness Admits to allergies; takes  Xyzal; denied any allergy symptoms today.  Protocols used: HIGH BLOOD PRESSURE-A-AH

## 2018-05-08 ENCOUNTER — Ambulatory Visit: Payer: Managed Care, Other (non HMO) | Admitting: Primary Care

## 2018-05-08 ENCOUNTER — Encounter: Payer: Self-pay | Admitting: Primary Care

## 2018-05-08 DIAGNOSIS — I10 Essential (primary) hypertension: Secondary | ICD-10-CM

## 2018-05-08 MED ORDER — LOSARTAN POTASSIUM 50 MG PO TABS: 50.0000 mg | ORAL_TABLET | Freq: Every day | ORAL | 0 refills | Status: DC

## 2018-05-08 NOTE — Progress Notes (Signed)
Subjective:    Patient ID: Madison [name], female    DOB: 1956-07-29, 62 y.o.   MRN: [id number]  HPI  Ms. Gupton is a 62 year old female with a history of hypertension, hyperlipidemia who presents today with a chief compliant of elevated blood pressure readings.  BP Readings from Last 3 Encounters:  05/08/18 (!) 148/94  02/28/18 132/82  12/19/17 (!) 142/82   She is currently managed on Losartan 25 mg. She started feeling headaches and "out of sorts" several weeks ago which prompted her to start checking her BP. This is how she felt prior to going Losartan when she was originally diagnosed. She's been checking her BP at work which is running 150/90, 138/90. She endorses stress at work but doesn't feel increased stress.   Review of Systems  Eyes: Negative for visual disturbance.  Respiratory: Negative for shortness of breath.   Cardiovascular: Negative for chest pain.  Neurological: Positive for headaches. Negative for dizziness.  Psychiatric/Behavioral: The patient is not nervous/anxious.        Past Medical History:  Diagnosis Date  . Arthritis   . Borderline diabetes   . Chicken pox   . Glaucoma   . Heart murmur   . Hypertension   . Osteopenia   . Seasonal allergies      Social History   Socioeconomic History  . Marital status: Married    Spouse name: Not on file  . Number of children: Not on file  . Years of education: Not on file  . Highest education level: Not on file  Occupational History  . Not on file  Social Needs  . Financial resource strain: Not on file  . Food insecurity:    Worry: Not on file    Inability: Not on file  . Transportation needs:    Medical: Not on file    Non-medical: Not on file  Tobacco Use  . Smoking status: Never Smoker  . Smokeless tobacco: Never Used  Substance and Sexual Activity  . Alcohol use: No    Alcohol/week: 0.0 oz  . Drug use: No  . Sexual activity: Not on file  Lifestyle  . Physical activity:    Days per week:  Not on file    Minutes per session: Not on file  . Stress: Not on file  Relationships  . Social connections:    Talks on phone: Not on file    Gets together: Not on file    Attends religious service: Not on file    Active member of club or organization: Not on file    Attends meetings of clubs or organizations: Not on file    Relationship status: Not on file  . Intimate partner violence:    Fear of current or ex partner: Not on file    Emotionally abused: Not on file    Physically abused: Not on file    Forced sexual activity: Not on file  Other Topics Concern  . Not on file  Social History Narrative   Married.   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    Past Surgical History:  Procedure Laterality Date  . BREAST BIOPSY Right 11/24/2017   right breast stereo path pending  . TONSILLECTOMY AND ADENOIDECTOMY  1961    Family History  Problem Relation Age of Onset  . Arthritis Mother   . Hyperlipidemia Mother   . Heart disease Mother   . Stroke Mother   .  Hypertension Mother   . Diabetes Father   . Diabetes Brother   . Arthritis Maternal Grandmother   . Diabetes Paternal Grandmother     No Known Allergies  Current Outpatient Medications on File Prior to Visit  Medication Sig Dispense Refill  . cetirizine (ZYRTEC) 10 MG tablet Take 10 mg by mouth daily.    Marland Kitchen losartan (COZAAR) 25 MG tablet TAKE 1 TABLET BY MOUTH EVERY DAY 90 tablet 3  . LUMIGAN 0.01 % SOLN INSTILL ONE DROP INTO BOTH EYES AT BEDTIME  6  . timolol (TIMOPTIC) 0.25 % ophthalmic solution USE 1 DROP(S) IN BOTH EYES ONCE IN THE MORNING FOR 30 DAYS  6   No current facility-administered medications on file prior to visit.     BP (!) 148/94   Pulse 61   Temp 98 F (36.7 C) (Oral)   Ht 5\' 3"  (1.6 m)   Wt 163 lb 12 oz (74.3 kg)   SpO2 98%   BMI 29.01 kg/m    Objective:   Physical Exam  Constitutional: She appears well-nourished.  Neck: Neck  supple.  Cardiovascular: Normal rate and regular rhythm.  Respiratory: Effort normal and breath sounds normal.  Skin: Skin is warm and dry.           Assessment & Plan:

## 2018-05-08 NOTE — Patient Instructions (Signed)
We've increased your losartan to 50 mg. You may take 2 of the 25 mg tablets to equal 50 mg until your bottle is empty.  Continue to monitor your blood pressure as discussed.  Schedule a follow up visit in 3 weeks for re-evaluation.   It was a pleasure to see you today!   DASH Eating Plan DASH stands for "Dietary Approaches to Stop Hypertension." The DASH eating plan is a healthy eating plan that has been shown to reduce high blood pressure (hypertension). It may also reduce your risk for type 2 diabetes, heart disease, and stroke. The DASH eating plan may also help with weight loss. What are tips for following this plan? General guidelines  Avoid eating more than 2,300 mg (milligrams) of salt (sodium) a day. If you have hypertension, you may need to reduce your sodium intake to 1,500 mg a day.  Limit alcohol intake to no more than 1 drink a day for nonpregnant women and 2 drinks a day for men. One drink equals 12 oz of beer, 5 oz of wine, or 1 oz of hard liquor.  Work with your health care provider to maintain a healthy body weight or to lose weight. Ask what an ideal weight is for you.  Get at least 30 minutes of exercise that causes your heart to beat faster (aerobic exercise) most days of the week. Activities may include walking, swimming, or biking.  Work with your health care provider or diet and nutrition specialist (dietitian) to adjust your eating plan to your individual calorie needs. Reading food labels  Check food labels for the amount of sodium per serving. Choose foods with less than 5 percent of the Daily Value of sodium. Generally, foods with less than 300 mg of sodium per serving fit into this eating plan.  To find whole grains, look for the word "whole" as the first word in the ingredient list. Shopping  Buy products labeled as "low-sodium" or "no salt added."  Buy fresh foods. Avoid canned foods and premade or frozen meals. Cooking  Avoid adding salt when  cooking. Use salt-free seasonings or herbs instead of table salt or sea salt. Check with your health care provider or pharmacist before using salt substitutes.  Do not fry foods. Cook foods using healthy methods such as baking, boiling, grilling, and broiling instead.  Cook with heart-healthy oils, such as olive, canola, soybean, or sunflower oil. Meal planning   Eat a balanced diet that includes: ? 5 or more servings of fruits and vegetables each day. At each meal, try to fill half of your plate with fruits and vegetables. ? Up to 6-8 servings of whole grains each day. ? Less than 6 oz of lean meat, poultry, or fish each day. A 3-oz serving of meat is about the same size as a deck of cards. One egg equals 1 oz. ? 2 servings of low-fat dairy each day. ? A serving of nuts, seeds, or beans 5 times each week. ? Heart-healthy fats. Healthy fats called Omega-3 fatty acids are found in foods such as flaxseeds and coldwater fish, like sardines, salmon, and mackerel.  Limit how much you eat of the following: ? Canned or prepackaged foods. ? Food that is high in trans fat, such as fried foods. ? Food that is high in saturated fat, such as fatty meat. ? Sweets, desserts, sugary drinks, and other foods with added sugar. ? Full-fat dairy products.  Do not salt foods before eating.  Try to eat at  least 2 vegetarian meals each week.  Eat more home-cooked food and less restaurant, buffet, and fast food.  When eating at a restaurant, ask that your food be prepared with less salt or no salt, if possible. What foods are recommended? The items listed may not be a complete list. Talk with your dietitian about what dietary choices are best for you. Grains Whole-grain or whole-wheat bread. Whole-grain or whole-wheat pasta. Brown rice. [place]row. Bulgur. Whole-grain and low-sodium cereals. Pita bread. Low-fat, low-sodium crackers. Whole-wheat flour tortillas. Vegetables Fresh or frozen vegetables  (raw, steamed, roasted, or grilled). Low-sodium or reduced-sodium tomato and vegetable juice. Low-sodium or reduced-sodium tomato sauce and tomato paste. Low-sodium or reduced-sodium canned vegetables. Fruits All fresh, dried, or frozen fruit. Canned fruit in natural juice (without added sugar). Meat and other protein foods Skinless chicken or [place]. Ground chicken or [place]. Pork with fat trimmed off. Fish and seafood. Egg whites. Dried beans, peas, or lentils. Unsalted nuts, nut butters, and seeds. Unsalted canned beans. Lean cuts of beef with fat trimmed off. Low-sodium, lean deli meat. Dairy Low-fat (1%) or fat-free (skim) milk. Fat-free, low-fat, or reduced-fat cheeses. Nonfat, low-sodium ricotta or cottage cheese. Low-fat or nonfat yogurt. Low-fat, low-sodium cheese. Fats and oils Soft margarine without trans fats. Vegetable oil. Low-fat, reduced-fat, or light mayonnaise and salad dressings (reduced-sodium). Canola, safflower, olive, soybean, and sunflower oils. Avocado. Seasoning and other foods Herbs. Spices. Seasoning mixes without salt. Unsalted popcorn and pretzels. Fat-free sweets. What foods are not recommended? The items listed may not be a complete list. Talk with your dietitian about what dietary choices are best for you. Grains Baked goods made with fat, such as croissants, muffins, or some breads. Dry pasta or rice meal packs. Vegetables Creamed or fried vegetables. Vegetables in a cheese sauce. Regular canned vegetables (not low-sodium or reduced-sodium). Regular canned tomato sauce and paste (not low-sodium or reduced-sodium). Regular tomato and vegetable juice (not low-sodium or reduced-sodium). Angie Fava. Olives. Fruits Canned fruit in a light or heavy syrup. Fried fruit. Fruit in cream or butter sauce. Meat and other protein foods Fatty cuts of meat. Ribs. Fried meat. Berniece Salines. Sausage. Bologna and other processed lunch meats. Salami. Fatback. Hotdogs. Bratwurst. Salted nuts  and seeds. Canned beans with added salt. Canned or smoked fish. Whole eggs or egg yolks. Chicken or [place] with skin. Dairy Whole or 2% milk, cream, and half-and-half. Whole or full-fat cream cheese. Whole-fat or sweetened yogurt. Full-fat cheese. Nondairy creamers. Whipped toppings. Processed cheese and cheese spreads. Fats and oils Butter. Stick margarine. Lard. Shortening. Ghee. Bacon fat. Tropical oils, such as coconut, palm kernel, or palm oil. Seasoning and other foods Salted popcorn and pretzels. Onion salt, garlic salt, seasoned salt, table salt, and sea salt. Worcestershire sauce. Tartar sauce. Barbecue sauce. Teriyaki sauce. Soy sauce, including reduced-sodium. Steak sauce. Canned and packaged gravies. Fish sauce. Oyster sauce. Cocktail sauce. Horseradish that you find on the shelf. Ketchup. Mustard. Meat flavorings and tenderizers. Bouillon cubes. Hot sauce and Tabasco sauce. Premade or packaged marinades. Premade or packaged taco seasonings. Relishes. Regular salad dressings. Where to find more information:  National Heart, Lung, and [place]: https://wilson-eaton.com/  American Heart Association: www.heart.org Summary  The DASH eating plan is a healthy eating plan that has been shown to reduce high blood pressure (hypertension). It may also reduce your risk for type 2 diabetes, heart disease, and stroke.  With the DASH eating plan, you should limit salt (sodium) intake to 2,300 mg a day. If you have hypertension, you  may need to reduce your sodium intake to 1,500 mg a day.  When on the DASH eating plan, aim to eat more fresh fruits and vegetables, whole grains, lean proteins, low-fat dairy, and heart-healthy fats.  Work with your health care provider or diet and nutrition specialist (dietitian) to adjust your eating plan to your individual calorie needs. This information is not intended to replace advice given to you by your health care provider. Make sure you discuss any questions  you have with your health care provider. Document Released: 10/21/2011 Document Revised: 10/25/2016 Document Reviewed: 10/25/2016 Elsevier Interactive Patient Education  [employer].

## 2018-05-08 NOTE — Assessment & Plan Note (Addendum)
Above goal in the office today, also on a prior visit and with work readings. No obvious cause to rise in levels.   Given headaches and other symptoms will increase Losartan to 50 mg. Will plan to see her back in the office in 2-3 weeks for BP check and BMP.  Also discussed to work on diet and regular exercise.

## 2018-05-30 ENCOUNTER — Encounter: Payer: Self-pay | Admitting: Primary Care

## 2018-05-30 ENCOUNTER — Ambulatory Visit (INDEPENDENT_AMBULATORY_CARE_PROVIDER_SITE_OTHER): Payer: Managed Care, Other (non HMO) | Admitting: Primary Care

## 2018-05-30 VITALS — BP 148/84 | HR 64 | Temp 98.3°F | Ht 63.0 in | Wt 164.5 lb

## 2018-05-30 DIAGNOSIS — R5383 Other fatigue: Secondary | ICD-10-CM | POA: Diagnosis not present

## 2018-05-30 DIAGNOSIS — E785 Hyperlipidemia, unspecified: Secondary | ICD-10-CM

## 2018-05-30 DIAGNOSIS — I10 Essential (primary) hypertension: Secondary | ICD-10-CM

## 2018-05-30 LAB — CBC
HEMATOCRIT: 38.4 % (ref 36.0–46.0)
HEMOGLOBIN: 12.6 g/dL (ref 12.0–15.0)
MCHC: 32.9 g/dL (ref 30.0–36.0)
MCV: 90.9 fl (ref 78.0–100.0)
PLATELETS: 197 10*3/uL (ref 150.0–400.0)
RBC: 4.22 Mil/uL (ref 3.87–5.11)
RDW: 14.7 % (ref 11.5–15.5)
WBC: 6.3 10*3/uL (ref 4.0–10.5)

## 2018-05-30 LAB — BASIC METABOLIC PANEL
BUN: 9 mg/dL (ref 6–23)
CO2: 28 mEq/L (ref 19–32)
CREATININE: 0.64 mg/dL (ref 0.40–1.20)
Calcium: 9.2 mg/dL (ref 8.4–10.5)
Chloride: 105 mEq/L (ref 96–112)
GFR: 121.03 mL/min (ref >60.00)
Glucose, Bld: 104 mg/dL — ABNORMAL HIGH (ref 70–99)
Potassium: 3.8 mEq/L (ref 3.5–5.1)
Sodium: 140 mEq/L (ref 135–145)

## 2018-05-30 LAB — VITAMIN B12: VITAMIN B 12: 140 pg/mL — AB (ref 211–911)

## 2018-05-30 LAB — TSH: TSH: 2.03 u[IU]/mL (ref 0.35–4.50)

## 2018-05-30 LAB — HEMOGLOBIN A1C: HEMOGLOBIN A1C: 5.7 % (ref 4.6–6.5)

## 2018-05-30 MED ORDER — ATORVASTATIN CALCIUM 10 MG PO TABS: ORAL_TABLET | ORAL | 3 refills | Status: DC

## 2018-05-30 MED ORDER — LOSARTAN POTASSIUM 50 MG PO TABS
100.0000 mg | ORAL_TABLET | Freq: Every day | ORAL | 0 refills | Status: DC
Start: 2018-05-30 — End: 2018-06-13

## 2018-05-30 NOTE — Assessment & Plan Note (Signed)
Slight improvement with increase of losartan to 50 mg, but not at goal. Suspect symptoms secondary to uncontrolled hypertension and believe symptoms will improve with better control.  ECG today with NSR, rate of 63, no ST elevation or t-wave inversion. No PAC/PVC.   Check CBC, TSH, BMP, B12 today, a1C.  Increase losartan to 100 mg. Follow up in 3 weeks for re-evaluation.

## 2018-05-30 NOTE — Assessment & Plan Note (Signed)
Discussed risk for CVD given hypertension, hyperlipidemia, and family history of heart disease. ASCVD risk score of 11%. Discussed this with patient, recommended low dose statin, she agrees.  Rx for atorvastatin 10 mg sent to pharmacy. Repeat lipids and check LFT's in 6 weeks.

## 2018-05-30 NOTE — Progress Notes (Signed)
Subjective:    Patient ID: Madison [name], female    DOB: [birth year], 62 y.o.   MRN: [id number]  HPI  Madison [name] is a 62 year old female who presents today for follow up of hypertension.  She was last evaluated on 05/08/18 with elevated blood pressure readings and headaches. She was managed on Losartan 25 mg which was increased to 50 mg during her last visit. She is due today for follow up and BMP.   BP Readings from Last 3 Encounters:  05/30/18 (!) 148/84  05/08/18 (!) 148/94  02/28/18 132/82   Since her last visit she's checked her blood pressure twice: July 4th 138/84 and July 14th 160/96. She continues to experience headaches, doesn't feel "quite right", notices increased sweating with activities, fatigue, intermittent chest "discomfort" with rest and exertion.    The 10-year ASCVD risk score Mikey Bussing DC Brooke Bonito., et al., 2013) is: 11%   Values used to calculate the score:     Age: 31 years     Sex: Female     Is Non-Hispanic African American: Yes     Diabetic: No     Tobacco smoker: No     Systolic Blood Pressure: 818 mmHg     Is BP treated: Yes     HDL Cholesterol: 50.8 mg/dL     Total Cholesterol: 196 mg/dL   Review of Systems  Constitutional: Positive for fatigue.       Intermittent sweating  Eyes: Negative for visual disturbance.  Respiratory: Negative for shortness of breath.   Cardiovascular:       "chest discomfort"  Gastrointestinal: Negative for abdominal pain, nausea and vomiting.  Neurological: Positive for headaches. Negative for dizziness.       Past Medical History:  Diagnosis Date  . Arthritis   . Borderline diabetes   . Chicken pox   . Glaucoma   . Heart murmur   . Hypertension   . Osteopenia   . Seasonal allergies      Social History   Socioeconomic History  . Marital status: Married    Spouse name: Not on file  . Number of children: Not on file  . Years of education: Not on file  . Highest education level: Not on file  Occupational History    . Not on file  Social Needs  . Financial resource strain: Not on file  . Food insecurity:    Worry: Not on file    Inability: Not on file  . Transportation needs:    Medical: Not on file    Non-medical: Not on file  Tobacco Use  . Smoking status: Never Smoker  . Smokeless tobacco: Never Used  Substance and Sexual Activity  . Alcohol use: No    Alcohol/week: 0.0 oz  . Drug use: No  . Sexual activity: Not on file  Lifestyle  . Physical activity:    Days per week: Not on file    Minutes per session: Not on file  . Stress: Not on file  Relationships  . Social connections:    Talks on phone: Not on file    Gets together: Not on file    Attends religious service: Not on file    Active member of club or organization: Not on file    Attends meetings of clubs or organizations: Not on file    Relationship status: Not on file  . Intimate partner violence:    Fear of current or ex partner: Not on file  Emotionally abused: Not on file    Physically abused: Not on file    Forced sexual activity: Not on file  Other Topics Concern  . Not on file  Social History Narrative   Married.   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    Past Surgical History:  Procedure Laterality Date  . BREAST BIOPSY Right 11/24/2017   right breast stereo path pending  . TONSILLECTOMY AND ADENOIDECTOMY  1961    Family History  Problem Relation Age of Onset  . Arthritis Mother   . Hyperlipidemia Mother   . Heart disease Mother   . Stroke Mother   . Hypertension Mother   . Diabetes Father   . Diabetes Brother   . Arthritis Maternal Grandmother   . Diabetes Paternal Grandmother     No Known Allergies  Current Outpatient Medications on File Prior to Visit  Medication Sig Dispense Refill  . cetirizine (ZYRTEC) 10 MG tablet Take 10 mg by mouth daily.    Marland Kitchen LUMIGAN 0.01 % SOLN INSTILL ONE DROP INTO BOTH EYES AT BEDTIME  6  . timolol  (TIMOPTIC) 0.25 % ophthalmic solution USE 1 DROP(S) IN BOTH EYES ONCE IN THE MORNING FOR 30 DAYS  6   No current facility-administered medications on file prior to visit.     BP (!) 148/84   Pulse 64   Temp 98.3 F (36.8 C) (Oral)   Ht 5\' 3"  (1.6 m)   Wt 164 lb 8 oz (74.6 kg)   SpO2 98%   BMI 29.14 kg/m    Objective:   Physical Exam  Constitutional: She appears well-nourished.  Neck: Neck supple.  Cardiovascular: Normal rate, regular rhythm and normal heart sounds.  No murmur heard. Respiratory: Effort normal and breath sounds normal.  Skin: Skin is warm and dry.  Psychiatric: She has a normal mood and affect.           Assessment & Plan:

## 2018-05-30 NOTE — Patient Instructions (Addendum)
Stop by the lab prior to leaving today. I will notify you of your results once received.   Increase your losartan to 100 mg, take two of your 50 mg tablets to equal 100 mg.   Start atorvastatin 10 mg tablets for cholesterol. Take 1 tablet by mouth every evening.   Continue to monitor your blood pressure if possible.  Schedule a follow up visit in 2-3 weeks for re-evaluation of blood pressure.  It was a pleasure to see you today!   DASH Eating Plan DASH stands for "Dietary Approaches to Stop Hypertension." The DASH eating plan is a healthy eating plan that has been shown to reduce high blood pressure (hypertension). It may also reduce your risk for type 2 diabetes, heart disease, and stroke. The DASH eating plan may also help with weight loss. What are tips for following this plan? General guidelines  Avoid eating more than 2,300 mg (milligrams) of salt (sodium) a day. If you have hypertension, you may need to reduce your sodium intake to 1,500 mg a day.  Limit alcohol intake to no more than 1 drink a day for nonpregnant women and 2 drinks a day for men. One drink equals 12 oz of beer, 5 oz of wine, or 1 oz of hard liquor.  Work with your health care provider to maintain a healthy body weight or to lose weight. Ask what an ideal weight is for you.  Get at least 30 minutes of exercise that causes your heart to beat faster (aerobic exercise) most days of the week. Activities may include walking, swimming, or biking.  Work with your health care provider or diet and nutrition specialist (dietitian) to adjust your eating plan to your individual calorie needs. Reading food labels  Check food labels for the amount of sodium per serving. Choose foods with less than 5 percent of the Daily Value of sodium. Generally, foods with less than 300 mg of sodium per serving fit into this eating plan.  To find whole grains, look for the word "whole" as the first word in the ingredient  list. Shopping  Buy products labeled as "low-sodium" or "no salt added."  Buy fresh foods. Avoid canned foods and premade or frozen meals. Cooking  Avoid adding salt when cooking. Use salt-free seasonings or herbs instead of table salt or sea salt. Check with your health care provider or pharmacist before using salt substitutes.  Do not fry foods. Cook foods using healthy methods such as baking, boiling, grilling, and broiling instead.  Cook with heart-healthy oils, such as olive, canola, soybean, or sunflower oil. Meal planning   Eat a balanced diet that includes: ? 5 or more servings of fruits and vegetables each day. At each meal, try to fill half of your plate with fruits and vegetables. ? Up to 6-8 servings of whole grains each day. ? Less than 6 oz of lean meat, poultry, or fish each day. A 3-oz serving of meat is about the same size as a deck of cards. One egg equals 1 oz. ? 2 servings of low-fat dairy each day. ? A serving of nuts, seeds, or beans 5 times each week. ? Heart-healthy fats. Healthy fats called Omega-3 fatty acids are found in foods such as flaxseeds and coldwater fish, like sardines, salmon, and mackerel.  Limit how much you eat of the following: ? Canned or prepackaged foods. ? Food that is high in trans fat, such as fried foods. ? Food that is high in saturated fat, such  as fatty meat. ? Sweets, desserts, sugary drinks, and other foods with added sugar. ? Full-fat dairy products.  Do not salt foods before eating.  Try to eat at least 2 vegetarian meals each week.  Eat more home-cooked food and less restaurant, buffet, and fast food.  When eating at a restaurant, ask that your food be prepared with less salt or no salt, if possible. What foods are recommended? The items listed may not be a complete list. Talk with your dietitian about what dietary choices are best for you. Grains Whole-grain or whole-wheat bread. Whole-grain or whole-wheat pasta. Brown  rice. [place]. Bulgur. Whole-grain and low-sodium cereals. Pita bread. Low-fat, low-sodium crackers. Whole-wheat flour tortillas. Vegetables Fresh or frozen vegetables (raw, steamed, roasted, or grilled). Low-sodium or reduced-sodium tomato and vegetable juice. Low-sodium or reduced-sodium tomato sauce and tomato paste. Low-sodium or reduced-sodium canned vegetables. Fruits All fresh, dried, or frozen fruit. Canned fruit in natural juice (without added sugar). Meat and other protein foods Skinless chicken or [place]. Ground chicken or [place]. Pork with fat trimmed off. Fish and seafood. Egg whites. Dried beans, peas, or lentils. Unsalted nuts, nut butters, and seeds. Unsalted canned beans. Lean cuts of beef with fat trimmed off. Low-sodium, lean deli meat. Dairy Low-fat (1%) or fat-free (skim) milk. Fat-free, low-fat, or reduced-fat cheeses. Nonfat, low-sodium ricotta or cottage cheese. Low-fat or nonfat yogurt. Low-fat, low-sodium cheese. Fats and oils Soft margarine without trans fats. Vegetable oil. Low-fat, reduced-fat, or light mayonnaise and salad dressings (reduced-sodium). Canola, safflower, olive, soybean, and sunflower oils. Avocado. Seasoning and other foods Herbs. Spices. Seasoning mixes without salt. Unsalted popcorn and pretzels. Fat-free sweets. What foods are not recommended? The items listed may not be a complete list. Talk with your dietitian about what dietary choices are best for you. Grains Baked goods made with fat, such as croissants, muffins, or some breads. Dry pasta or rice meal packs. Vegetables Creamed or fried vegetables. Vegetables in a cheese sauce. Regular canned vegetables (not low-sodium or reduced-sodium). Regular canned tomato sauce and paste (not low-sodium or reduced-sodium). Regular tomato and vegetable juice (not low-sodium or reduced-sodium). Angie Fava. Olives. Fruits Canned fruit in a light or heavy syrup. Fried fruit. Fruit in cream or butter  sauce. Meat and other protein foods Fatty cuts of meat. Ribs. Fried meat. Berniece Salines. Sausage. Bologna and other processed lunch meats. Salami. Fatback. Hotdogs. Bratwurst. Salted nuts and seeds. Canned beans with added salt. Canned or smoked fish. Whole eggs or egg yolks. Chicken or [place] with skin. Dairy Whole or 2% milk, cream, and half-and-half. Whole or full-fat cream cheese. Whole-fat or sweetened yogurt. Full-fat cheese. Nondairy creamers. Whipped toppings. Processed cheese and cheese spreads. Fats and oils Butter. Stick margarine. Lard. Shortening. Ghee. Bacon fat. Tropical oils, such as coconut, palm kernel, or palm oil. Seasoning and other foods Salted popcorn and pretzels. Onion salt, garlic salt, seasoned salt, table salt, and sea salt. Worcestershire sauce. Tartar sauce. Barbecue sauce. Teriyaki sauce. Soy sauce, including reduced-sodium. Steak sauce. Canned and packaged gravies. Fish sauce. Oyster sauce. Cocktail sauce. Horseradish that you find on the shelf. Ketchup. Mustard. Meat flavorings and tenderizers. Bouillon cubes. Hot sauce and Tabasco sauce. Premade or packaged marinades. Premade or packaged taco seasonings. Relishes. Regular salad dressings. Where to find more information:  National Heart, Lung, and [place]: https://wilson-eaton.com/  American Heart Association: www.heart.org Summary  The DASH eating plan is a healthy eating plan that has been shown to reduce high blood pressure (hypertension). It may also reduce your risk  for type 2 diabetes, heart disease, and stroke.  With the DASH eating plan, you should limit salt (sodium) intake to 2,300 mg a day. If you have hypertension, you may need to reduce your sodium intake to 1,500 mg a day.  When on the DASH eating plan, aim to eat more fresh fruits and vegetables, whole grains, lean proteins, low-fat dairy, and heart-healthy fats.  Work with your health care provider or diet and nutrition specialist (dietitian) to adjust  your eating plan to your individual calorie needs. This information is not intended to replace advice given to you by your health care provider. Make sure you discuss any questions you have with your health care provider. Document Released: 10/21/2011 Document Revised: 10/25/2016 Document Reviewed: 10/25/2016 Elsevier Interactive Patient Education  [employer].

## 2018-06-13 ENCOUNTER — Encounter: Payer: Self-pay | Admitting: Primary Care

## 2018-06-13 ENCOUNTER — Ambulatory Visit (INDEPENDENT_AMBULATORY_CARE_PROVIDER_SITE_OTHER): Payer: Managed Care, Other (non HMO) | Admitting: Primary Care

## 2018-06-13 VITALS — BP 128/78 | HR 67 | Temp 98.2°F | Ht 63.0 in | Wt 164.8 lb

## 2018-06-13 DIAGNOSIS — I10 Essential (primary) hypertension: Secondary | ICD-10-CM

## 2018-06-13 DIAGNOSIS — E785 Hyperlipidemia, unspecified: Secondary | ICD-10-CM | POA: Diagnosis not present

## 2018-06-13 MED ORDER — LOSARTAN POTASSIUM 100 MG PO TABS: ORAL_TABLET | ORAL | 2 refills | Status: DC

## 2018-06-13 NOTE — Assessment & Plan Note (Signed)
Improved in the office today. Symptoms have resolved. Continue losartan 100 mg daily, new Rx sent to pharmacy.

## 2018-06-13 NOTE — Progress Notes (Signed)
Subjective:    Patient ID: Madison [name], female    DOB: Nov 25, 1955, 62 y.o.   MRN: [id number]  HPI  Madison [name] is a 62 year old female who presents today for follow up of hypertension.  She was last evaluated on 05/30/18 with continued symptoms of headaches, fatigue, chest "disomfort". ECG was unremarkable. Her blood pressure was also elevated and her symptoms were considered to be partially secondary to uncontrolled BP readings so her losartan was increased from 50 mg to 100 mg.  Since her last visit she's feeling much better. She denies chest discomfort, chest pain, headaches, "feeling off". She's checking her BP at home and is getting readings of 130's/80's.   BP Readings from Last 3 Encounters:  06/13/18 128/78  05/30/18 (!) 148/84  05/08/18 (!) 148/94     Review of Systems  Eyes: Negative for visual disturbance.  Respiratory: Negative for shortness of breath.   Cardiovascular: Negative for chest pain.  Neurological: Negative for dizziness and headaches.       Past Medical History:  Diagnosis Date  . Arthritis   . Borderline diabetes   . Chicken pox   . Glaucoma   . Heart murmur   . Hypertension   . Osteopenia   . Seasonal allergies      Social History   Socioeconomic History  . Marital status: Married    Spouse name: Not on file  . Number of children: Not on file  . Years of education: Not on file  . Highest education level: Not on file  Occupational History  . Not on file  Social Needs  . Financial resource strain: Not on file  . Food insecurity:    Worry: Not on file    Inability: Not on file  . Transportation needs:    Medical: Not on file    Non-medical: Not on file  Tobacco Use  . Smoking status: Never Smoker  . Smokeless tobacco: Never Used  Substance and Sexual Activity  . Alcohol use: No    Alcohol/week: 0.0 oz  . Drug use: No  . Sexual activity: Not on file  Lifestyle  . Physical activity:    Days per week: Not on file    Minutes per  session: Not on file  . Stress: Not on file  Relationships  . Social connections:    Talks on phone: Not on file    Gets together: Not on file    Attends religious service: Not on file    Active member of club or organization: Not on file    Attends meetings of clubs or organizations: Not on file    Relationship status: Not on file  . Intimate partner violence:    Fear of current or ex partner: Not on file    Emotionally abused: Not on file    Physically abused: Not on file    Forced sexual activity: Not on file  Other Topics Concern  . Not on file  Social History Narrative   Married.   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    Past Surgical History:  Procedure Laterality Date  . BREAST BIOPSY Right 11/24/2017   right breast stereo path pending  . TONSILLECTOMY AND ADENOIDECTOMY  1961    Family History  Problem Relation Age of Onset  . Arthritis Mother   . Hyperlipidemia Mother   . Heart disease Mother   . Stroke Mother   . Hypertension  Mother   . Diabetes Father   . Diabetes Brother   . Arthritis Maternal Grandmother   . Diabetes Paternal Grandmother     No Known Allergies  Current Outpatient Medications on File Prior to Visit  Medication Sig Dispense Refill  . atorvastatin (LIPITOR) 10 MG tablet Take 1 tablet by mouth every evening for cholesterol. 90 tablet 3  . cetirizine (ZYRTEC) 10 MG tablet Take 10 mg by mouth daily.    Marland Kitchen LUMIGAN 0.01 % SOLN INSTILL ONE DROP INTO BOTH EYES AT BEDTIME  6  . timolol (TIMOPTIC) 0.25 % ophthalmic solution USE 1 DROP(S) IN BOTH EYES ONCE IN THE MORNING FOR 30 DAYS  6   No current facility-administered medications on file prior to visit.     BP 128/78   Pulse 67   Temp 98.2 F (36.8 C) (Oral)   Ht 5\' 3"  (1.6 m)   Wt 164 lb 12 oz (74.7 kg)   SpO2 98%   BMI 29.18 kg/m    Objective:   Physical Exam  Constitutional: She appears well-nourished.  Neck: Neck supple.   Cardiovascular: Normal rate and regular rhythm.  Respiratory: Effort normal and breath sounds normal.  Skin: Skin is warm and dry.  Psychiatric: She has a normal mood and affect.           Assessment & Plan:

## 2018-06-13 NOTE — Patient Instructions (Addendum)
Continue taking losartan 100 mg for blood pressure. I sent a prescription for the 100 mg tablets. Make sure to only take one of the 100 mg tablets once daily when received.  Continue atorvastatin 10 mg for cholesterol and heart protection.  Schedule a lab only appointment for cholesterol check in 5 weeks.   We will see you next April for your annual physical or sooner if needed.  It was a pleasure to see you today!

## 2018-06-13 NOTE — Assessment & Plan Note (Signed)
Compliant to atorvastatin. Repeat lipids in 5-6 weeks.

## 2018-07-20 ENCOUNTER — Other Ambulatory Visit: Payer: Managed Care, Other (non HMO)

## 2018-07-25 ENCOUNTER — Other Ambulatory Visit (INDEPENDENT_AMBULATORY_CARE_PROVIDER_SITE_OTHER): Payer: Managed Care, Other (non HMO)

## 2018-07-25 DIAGNOSIS — E785 Hyperlipidemia, unspecified: Secondary | ICD-10-CM | POA: Diagnosis not present

## 2018-07-25 LAB — COMPREHENSIVE METABOLIC PANEL
ALT: 13 U/L (ref 0–35)
AST: 15 U/L (ref 0–37)
Albumin: 3.9 g/dL (ref 3.5–5.2)
Alkaline Phosphatase: 49 U/L (ref 39–117)
BUN: 11 mg/dL (ref 6–23)
CHLORIDE: 108 meq/L (ref 96–112)
CO2: 28 mEq/L (ref 19–32)
Calcium: 8.9 mg/dL (ref 8.4–10.5)
Creatinine, Ser: 0.66 mg/dL (ref 0.40–1.20)
GFR: 116.75 mL/min (ref >60.00)
Glucose, Bld: 95 mg/dL (ref 70–99)
Potassium: 4 mEq/L (ref 3.5–5.1)
Sodium: 141 mEq/L (ref 135–145)
Total Bilirubin: 0.3 mg/dL (ref 0.2–1.2)
Total Protein: 7 g/dL (ref 6.0–8.3)

## 2018-07-25 LAB — LIPID PANEL
CHOLESTEROL: 129 mg/dL (ref 0–200)
HDL: 58 mg/dL (ref >39.00)
LDL CALC: 61 mg/dL (ref 0–99)
NonHDL: 70.67
TRIGLYCERIDES: 49 mg/dL (ref 0.0–149.0)
Total CHOL/HDL Ratio: 2
VLDL: 9.8 mg/dL (ref 0.0–40.0)

## 2018-07-28 ENCOUNTER — Encounter: Payer: Self-pay | Admitting: *Deleted

## 2018-12-05 ENCOUNTER — Other Ambulatory Visit: Payer: Self-pay | Admitting: Surgery

## 2018-12-05 DIAGNOSIS — N6489 Other specified disorders of breast: Secondary | ICD-10-CM

## 2018-12-06 ENCOUNTER — Other Ambulatory Visit: Payer: Self-pay

## 2018-12-06 ENCOUNTER — Other Ambulatory Visit: Payer: Self-pay | Admitting: Surgery

## 2018-12-06 DIAGNOSIS — N6489 Other specified disorders of breast: Secondary | ICD-10-CM

## 2018-12-07 ENCOUNTER — Ambulatory Visit
Admission: RE | Admit: 2018-12-07 | Discharge: 2018-12-07 | Disposition: A | Discharge status: Home / Self Care | Payer: Managed Care, Other (non HMO) | Source: Ambulatory Visit | Attending: Surgery | Admitting: Surgery

## 2018-12-07 DIAGNOSIS — N6489 Other specified disorders of breast: Secondary | ICD-10-CM

## 2019-03-27 ENCOUNTER — Other Ambulatory Visit: Payer: Self-pay | Admitting: Primary Care

## 2019-03-27 DIAGNOSIS — I10 Essential (primary) hypertension: Secondary | ICD-10-CM

## 2019-05-11 ENCOUNTER — Other Ambulatory Visit: Payer: Self-pay | Admitting: Primary Care

## 2019-05-11 DIAGNOSIS — R7303 Prediabetes: Secondary | ICD-10-CM

## 2019-05-11 DIAGNOSIS — E785 Hyperlipidemia, unspecified: Secondary | ICD-10-CM

## 2019-05-11 DIAGNOSIS — I10 Essential (primary) hypertension: Secondary | ICD-10-CM

## 2019-05-11 DIAGNOSIS — E538 Deficiency of other specified B group vitamins: Secondary | ICD-10-CM

## 2019-05-16 ENCOUNTER — Telehealth: Payer: Self-pay

## 2019-05-16 NOTE — Telephone Encounter (Signed)
Left detailed VM w COVID screen and back door lab info   

## 2019-05-17 ENCOUNTER — Other Ambulatory Visit (INDEPENDENT_AMBULATORY_CARE_PROVIDER_SITE_OTHER): Payer: Managed Care, Other (non HMO)

## 2019-05-17 DIAGNOSIS — I10 Essential (primary) hypertension: Secondary | ICD-10-CM | POA: Diagnosis not present

## 2019-05-17 DIAGNOSIS — E785 Hyperlipidemia, unspecified: Secondary | ICD-10-CM | POA: Diagnosis not present

## 2019-05-17 DIAGNOSIS — E538 Deficiency of other specified B group vitamins: Secondary | ICD-10-CM

## 2019-05-17 DIAGNOSIS — R7303 Prediabetes: Secondary | ICD-10-CM

## 2019-05-17 LAB — COMPREHENSIVE METABOLIC PANEL
ALT: 11 U/L (ref 0–35)
AST: 14 U/L (ref 0–37)
Albumin: 4.1 g/dL (ref 3.5–5.2)
Alkaline Phosphatase: 55 U/L (ref 39–117)
BUN: 10 mg/dL (ref 6–23)
CO2: 26 mEq/L (ref 19–32)
Calcium: 8.8 mg/dL (ref 8.4–10.5)
Chloride: 109 mEq/L (ref 96–112)
Creatinine, Ser: 0.68 mg/dL (ref 0.40–1.20)
GFR: 105.84 mL/min (ref >60.00)
Glucose, Bld: 89 mg/dL (ref 70–99)
Potassium: 3.9 mEq/L (ref 3.5–5.1)
Sodium: 142 mEq/L (ref 135–145)
Total Bilirubin: 0.5 mg/dL (ref 0.2–1.2)
Total Protein: 6.8 g/dL (ref 6.0–8.3)

## 2019-05-17 LAB — LIPID PANEL
Cholesterol: 131 mg/dL (ref 0–200)
HDL: 55.3 mg/dL (ref >39.00)
LDL Cholesterol: 63 mg/dL (ref 0–99)
NonHDL: 75.28
Total CHOL/HDL Ratio: 2
Triglycerides: 62 mg/dL (ref 0.0–149.0)
VLDL: 12.4 mg/dL (ref 0.0–40.0)

## 2019-05-17 LAB — VITAMIN B12: Vitamin B-12: 419 pg/mL (ref 211–911)

## 2019-05-17 LAB — HEMOGLOBIN A1C: Hgb A1c MFr Bld: 5.8 % (ref 4.6–6.5)

## 2019-05-22 ENCOUNTER — Ambulatory Visit (INDEPENDENT_AMBULATORY_CARE_PROVIDER_SITE_OTHER): Payer: Managed Care, Other (non HMO) | Admitting: Primary Care

## 2019-05-22 ENCOUNTER — Other Ambulatory Visit: Payer: Self-pay

## 2019-05-22 ENCOUNTER — Encounter: Payer: Self-pay | Admitting: Primary Care

## 2019-05-22 VITALS — BP 128/72 | HR 69 | Temp 98.2°F | Ht 63.0 in | Wt 166.5 lb

## 2019-05-22 DIAGNOSIS — R7303 Prediabetes: Secondary | ICD-10-CM | POA: Insufficient documentation

## 2019-05-22 DIAGNOSIS — I10 Essential (primary) hypertension: Secondary | ICD-10-CM

## 2019-05-22 DIAGNOSIS — Z23 Encounter for immunization: Secondary | ICD-10-CM | POA: Diagnosis not present

## 2019-05-22 DIAGNOSIS — Z1211 Encounter for screening for malignant neoplasm of colon: Secondary | ICD-10-CM

## 2019-05-22 DIAGNOSIS — E559 Vitamin D deficiency, unspecified: Secondary | ICD-10-CM

## 2019-05-22 DIAGNOSIS — Z0000 Encounter for general adult medical examination without abnormal findings: Secondary | ICD-10-CM

## 2019-05-22 DIAGNOSIS — M1990 Unspecified osteoarthritis, unspecified site: Secondary | ICD-10-CM | POA: Insufficient documentation

## 2019-05-22 DIAGNOSIS — M159 Polyosteoarthritis, unspecified: Secondary | ICD-10-CM

## 2019-05-22 DIAGNOSIS — E785 Hyperlipidemia, unspecified: Secondary | ICD-10-CM

## 2019-05-22 DIAGNOSIS — M8580 Other specified disorders of bone density and structure, unspecified site: Secondary | ICD-10-CM

## 2019-05-22 MED ORDER — VITAMIN D (ERGOCALCIFEROL) 1.25 MG (50000 UNIT) PO CAPS: ORAL_CAPSULE | ORAL | 0 refills | Status: DC

## 2019-05-22 NOTE — Progress Notes (Signed)
Subjective:    Patient ID: Madison [name], female    DOB: 1956-10-03, 63 y.o.   MRN: [id number]  HPI  Madison [name] is a 63 year old female who presents today for complete physical.  Immunizations: -Tetanus: Completed in 2017 -Influenza: Due this season  -Shingles: Never completed   Diet: She endorses a healthy diet. She eats mostly chicken, [place], vegetables, fruit. She eats sweets daily. She is drinking mostly diet soda, some water, lemonade, and tea.  Exercise: She is not exercising   Eye exam: Completed in June 2020 Dental exam: Completes semi-annually  Colonoscopy: Due. Last completed in 2008 Pap Smear: Completed in 2019 Mammogram: Completed in January 2020 Hep C Screen: Negative  BP Readings from Last 3 Encounters:  05/22/19 128/72  06/13/18 128/78  05/30/18 (!) 148/84     Review of Systems  Constitutional: Negative for unexpected weight change.  HENT: Negative for rhinorrhea.   Respiratory: Negative for cough and shortness of breath.   Cardiovascular: Negative for chest pain.  Gastrointestinal: Negative for constipation and diarrhea.  Genitourinary: Negative for difficulty urinating.  Musculoskeletal: Positive for arthralgias.       Chronic arthritis to hands, elbows, fingers, knees. Increased discomfort to joints. 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   Skin: Negative for rash.  Allergic/Immunologic: Negative for environmental allergies.  Neurological: Negative for dizziness, numbness and headaches.  Psychiatric/Behavioral: The patient is not nervous/anxious.        Past Medical History:  Diagnosis Date  . Arthritis   . Borderline diabetes   . Chicken pox   . Glaucoma   . Heart murmur   . Hypertension   . Osteopenia   . Seasonal allergies      Social History   Socioeconomic History  . Marital status: Married    Spouse name: Not on file  . Number of children: Not on file  . Years of education: Not on file  . Highest  education level: Not on file  Occupational History  . Not on file  Social Needs  . Financial resource strain: Not on file  . Food insecurity    Worry: Not on file    Inability: Not on file  . Transportation needs    Medical: Not on file    Non-medical: Not on file  Tobacco Use  . Smoking status: Never Smoker  . Smokeless tobacco: Never Used  Substance and Sexual Activity  . Alcohol use: No    Alcohol/week: 0.0 standard drinks  . Drug use: No  . Sexual activity: Not on file  Lifestyle  . Physical activity    Days per week: Not on file    Minutes per session: Not on file  . Stress: Not on file  Relationships  . Social Herbalist on phone: Not on file    Gets together: Not on file    Attends religious service: Not on file    Active member of club or organization: Not on file    Attends meetings of clubs or organizations: Not on file    Relationship status: Not on file  . Intimate partner violence    Fear of current or ex partner: Not on file    Emotionally abused: Not on file    Physically abused: Not on file    Forced sexual activity: Not on file  Other Topics Concern  . Not on file  Social History Narrative   Married.  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    Past Surgical History:  Procedure Laterality Date  . BREAST BIOPSY Right 11/24/2017   right breast stereo path pending  . TONSILLECTOMY AND ADENOIDECTOMY  1961    Family History  Problem Relation Age of Onset  . Arthritis Mother   . Hyperlipidemia Mother   . Heart disease Mother   . Stroke Mother   . Hypertension Mother   . Diabetes Father   . Diabetes Brother   . Arthritis Maternal Grandmother   . Diabetes Paternal Grandmother     No Known Allergies  Current Outpatient Medications on File Prior to Visit  Medication Sig Dispense Refill  . atorvastatin (LIPITOR) 10 MG tablet Take 1 tablet by mouth every evening for cholesterol.  90 tablet 3  . levocetirizine (XYZAL) 5 MG tablet Take 5 mg by mouth every evening.    Marland Kitchen losartan (COZAAR) 100 MG tablet TAKE 1 TABLET BY MOUTH EVERY DAY FOR BLOOD PRESSURE 90 tablet 0  . LUMIGAN 0.01 % SOLN INSTILL ONE DROP INTO BOTH EYES AT BEDTIME  6  . timolol (TIMOPTIC) 0.25 % ophthalmic solution USE 1 DROP(S) IN BOTH EYES ONCE IN THE MORNING FOR 30 DAYS  6   No current facility-administered medications on file prior to visit.     BP 128/72   Pulse 69   Temp 98.2 F (36.8 C) (Tympanic)   Ht 5\' 3"  (1.6 m)   Wt 166 lb 8 oz (75.5 kg)   SpO2 98%   BMI 29.49 kg/m    Objective:   Physical Exam  Constitutional: She is oriented to person, place, and time. She appears well-nourished.  HENT:  Mouth/Throat: No oropharyngeal exudate.  Eyes: Pupils are equal, round, and reactive to light. EOM are normal.  Neck: Neck supple. No thyromegaly present.  Cardiovascular: Normal rate and regular rhythm.  Respiratory: Effort normal and breath sounds normal.  GI: Soft. Bowel sounds are normal. There is no abdominal tenderness.  Musculoskeletal: Normal range of motion.  Neurological: She is alert and oriented to person, place, and time.  Skin: Skin is warm and dry.  Psychiatric: She has a normal mood and affect.           Assessment & Plan:

## 2019-05-22 NOTE — Assessment & Plan Note (Signed)
Stable compared to last year. Encouraged her to work on regular exercise and decrease sweets. Increase water.  Continue to monitor.

## 2019-05-22 NOTE — Assessment & Plan Note (Signed)
Stable, no new changes. Compliant to regimen. Continue same.

## 2019-05-22 NOTE — Assessment & Plan Note (Signed)
Not taking vitamin D, prefers 50,000 unit dose once weekly. Refill sent to pharmacy. Repeat vitamin D in 3 months.  Recommended weight bearing exercise.

## 2019-05-22 NOTE — Patient Instructions (Signed)
Start vitamin D 50,000 unit capsules weekly.  Make sure to take calcium 1200 units daily.  Start exercising. You should be getting 150 minutes of moderate intensity exercise weekly.  Continue to work on your diet by reducing sweets, sweet drinks, and increase water.  Schedule a nurse visit for 2-6 months for your second dose of Shingrix vaccination.  Schedule a lab only appointment for 3 months to repeat your vitamin D level.  It was a pleasure to see you today!   Preventive Care 62-2 Years Old, Female Preventive care refers to visits with your health care provider and lifestyle choices that can promote health and wellness. This includes:  A yearly physical exam. This may also be called an annual well check.  Regular dental visits and eye exams.  Immunizations.  Screening for certain conditions.  Healthy lifestyle choices, such as eating a healthy diet, getting regular exercise, not using drugs or products that contain nicotine and tobacco, and limiting alcohol use. What can I expect for my preventive care visit? Physical exam Your health care provider will check your:  Height and weight. This may be used to calculate body mass index (BMI), which tells if you are at a healthy weight.  Heart rate and blood pressure.  Skin for abnormal spots. Counseling Your health care provider may ask you questions about your:  Alcohol, tobacco, and drug use.  Emotional well-being.  Home and relationship well-being.  Sexual activity.  Eating habits.  Work and work Statistician.  Method of birth control.  Menstrual cycle.  Pregnancy history. What immunizations do I need?  Influenza (flu) vaccine  This is recommended every year. Tetanus, diphtheria, and pertussis (Tdap) vaccine  You may need a Td booster every 10 years. Varicella (chickenpox) vaccine  You may need this if you have not been vaccinated. Zoster (shingles) vaccine  You may need this after age 43.  Measles, mumps, and rubella (MMR) vaccine  You may need at least one dose of MMR if you were born in 1957 or later. You may also need a second dose. Pneumococcal conjugate (PCV13) vaccine  You may need this if you have certain conditions and were not previously vaccinated. Pneumococcal polysaccharide (PPSV23) vaccine  You may need one or two doses if you smoke cigarettes or if you have certain conditions. Meningococcal conjugate (MenACWY) vaccine  You may need this if you have certain conditions. Hepatitis A vaccine  You may need this if you have certain conditions or if you travel or work in places where you may be exposed to hepatitis A. Hepatitis B vaccine  You may need this if you have certain conditions or if you travel or work in places where you may be exposed to hepatitis B. Haemophilus influenzae type b (Hib) vaccine  You may need this if you have certain conditions. Human papillomavirus (HPV) vaccine  If recommended by your health care provider, you may need three doses over 6 months. You may receive vaccines as individual doses or as more than one vaccine together in one shot (combination vaccines). Talk with your health care provider about the risks and benefits of combination vaccines. What tests do I need? Blood tests  Lipid and cholesterol levels. These may be checked every 5 years, or more frequently if you are over 3 years old.  Hepatitis C test.  Hepatitis B test. Screening  Lung cancer screening. You may have this screening every year starting at age 17 if you have a 30-pack-year history of smoking and currently  smoke or have quit within the past 15 years.  Colorectal cancer screening. All adults should have this screening starting at age 7 and continuing until age 7. Your health care provider may recommend screening at age 69 if you are at increased risk. You will have tests every 1-10 years, depending on your results and the type of screening test.   Diabetes screening. This is done by checking your blood sugar (glucose) after you have not eaten for a while (fasting). You may have this done every 1-3 years.  Mammogram. This may be done every 1-2 years. Talk with your health care provider about when you should start having regular mammograms. This may depend on whether you have a family history of breast cancer.  BRCA-related cancer screening. This may be done if you have a family history of breast, ovarian, tubal, or peritoneal cancers.  Pelvic exam and Pap test. This may be done every 3 years starting at age 63. Starting at age 5, this may be done every 5 years if you have a Pap test in combination with an HPV test. Other tests  Sexually transmitted disease (STD) testing.  Bone density scan. This is done to screen for osteoporosis. You may have this scan if you are at high risk for osteoporosis. Follow these instructions at home: Eating and drinking  Eat a diet that includes fresh fruits and vegetables, whole grains, lean protein, and low-fat dairy.  Take vitamin and mineral supplements as recommended by your health care provider.  Do not drink alcohol if: ? Your health care provider tells you not to drink. ? You are pregnant, may be pregnant, or are planning to become pregnant.  If you drink alcohol: ? Limit how much you have to 0-1 drink a day. ? Be aware of how much alcohol is in your drink. In the [place] one drink equals one 12 oz bottle of beer (355 mL), one 5 oz glass of wine (148 mL), or one 1 oz glass of hard liquor (44 mL). Lifestyle  Take daily care of your teeth and gums.  Stay active. Exercise for at least 30 minutes on 5 or more days each week.  Do not use any products that contain nicotine or tobacco, such as cigarettes, e-cigarettes, and chewing tobacco. If you need help quitting, ask your health care provider.  If you are sexually active, practice safe sex. Use a condom or other form of birth control  (contraception) in order to prevent pregnancy and STIs (sexually transmitted infections).  If told by your health care provider, take low-dose aspirin daily starting at age 96. What's next?  Visit your health care provider once a year for a well check visit.  Ask your health care provider how often you should have your eyes and teeth checked.  Stay up to date on all vaccines. This information is not intended to replace advice given to you by your health care provider. Make sure you discuss any questions you have with your health care provider. Document Released: 11/28/2015 Document Revised: 07/13/2018 Document Reviewed: 07/13/2018 Elsevier Patient Education  2020 [employer].

## 2019-05-22 NOTE — Assessment & Plan Note (Signed)
Tetanus UTD, first Shingrix provided today. Discussed due date for second dose. Mammogram UTD. Pap smear UTD. Recommended regular exercise, work on diet. Exam unremarkable. Labs reviewed.

## 2019-05-22 NOTE — Assessment & Plan Note (Signed)
LDL and lipids well controlled. Continue atorvastatin as prescribed.

## 2019-05-22 NOTE — Assessment & Plan Note (Signed)
Stable in the office today, continue losartan 100 mg. CMP unremarkable.

## 2019-05-22 NOTE — Addendum Note (Signed)
Addended by: Jacqualin Combes on: 05/22/2019 11:33 AM   Modules accepted: Orders

## 2019-05-22 NOTE — Assessment & Plan Note (Signed)
Chronic, increased recently. Discussed importance of mobility, discussed use of Tylenol Arthritis.

## 2019-05-24 ENCOUNTER — Other Ambulatory Visit: Payer: Self-pay | Admitting: Primary Care

## 2019-05-24 DIAGNOSIS — E785 Hyperlipidemia, unspecified: Secondary | ICD-10-CM

## 2019-06-05 ENCOUNTER — Encounter: Payer: Self-pay | Admitting: Gastroenterology

## 2019-06-25 ENCOUNTER — Ambulatory Visit (AMBULATORY_SURGERY_CENTER): Payer: Self-pay | Admitting: *Deleted

## 2019-06-25 ENCOUNTER — Other Ambulatory Visit: Payer: Self-pay

## 2019-06-25 VITALS — Temp 96.9°F | Ht 63.0 in | Wt 166.0 lb

## 2019-06-25 DIAGNOSIS — Z1211 Encounter for screening for malignant neoplasm of colon: Secondary | ICD-10-CM

## 2019-06-25 MED ORDER — NA SULFATE-K SULFATE-MG SULF 17.5-3.13-1.6 GM/177ML PO SOLN: ORAL | 0 refills | Status: DC

## 2019-06-25 NOTE — Progress Notes (Signed)
Patient is here for PV today. Patient denies any allergies to eggs or soy. Patient denies any problems with anesthesia/sedation. Patient denies any oxygen use at home. Patient denies taking any diet/weight loss medications or blood thinners. Pt requested low volume prep. suprep given with $15 off coupon. Pt is aware that care partner will wait in the car during procedure; if they feel like they will be too hot to wait in the car; they may wait in the lobby.  We want them to wear a mask (we do not have any that we can provide them), practice social distancing, and we will check their temperatures when they get here.  I did remind patient that their care partner needs to stay in the parking lot the entire time. Pt will wear mask into building.

## 2019-06-26 ENCOUNTER — Encounter: Payer: Self-pay | Admitting: Internal Medicine

## 2019-06-29 ENCOUNTER — Telehealth: Payer: Self-pay | Admitting: Gastroenterology

## 2019-06-29 NOTE — Telephone Encounter (Signed)
Pt responded "no" to all screening questions °

## 2019-06-29 NOTE — Telephone Encounter (Signed)
Left message to call back to ask Covid-19 screening questions.  Covid-19 Screening Questions: Do you now or have you had a fever in the last 14 days?  Do you have any respiratory symptoms of shortness of breath or cough now or in the last 14 days?  Do you have any family members or close contacts with diagnosed or suspected Covid-19 in the past 14 days?  Have you been tested for Covid-19 and found to be positive?   Pls make pt aware of that care partner may wait in the car or come up to the lobby during the procedure but will need to provide their own mask.

## 2019-07-02 ENCOUNTER — Other Ambulatory Visit: Payer: Self-pay

## 2019-07-02 ENCOUNTER — Ambulatory Visit (AMBULATORY_SURGERY_CENTER): Payer: Managed Care, Other (non HMO) | Admitting: Gastroenterology

## 2019-07-02 ENCOUNTER — Encounter: Payer: Self-pay | Admitting: Gastroenterology

## 2019-07-02 VITALS — BP 128/82 | HR 62 | Temp 97.3°F | Resp 11 | Ht 63.0 in | Wt 166.0 lb

## 2019-07-02 DIAGNOSIS — D120 Benign neoplasm of cecum: Secondary | ICD-10-CM | POA: Diagnosis not present

## 2019-07-02 DIAGNOSIS — Z1211 Encounter for screening for malignant neoplasm of colon: Secondary | ICD-10-CM | POA: Diagnosis not present

## 2019-07-02 MED ORDER — SODIUM CHLORIDE 0.9 % IV SOLN: 500.0000 mL | Freq: Once | INTRAVENOUS | Status: DC

## 2019-07-02 NOTE — Op Note (Signed)
[place] Patient Name: Madison [name] Procedure Date: 07/02/2019 1:24 PM MRN: [id number] Endoscopist: Milus Banister , MD Age: 63 Referring MD:  Date of Birth: 23-Nov-1955 Gender: Female Account #: [id number] Procedure:                Colonoscopy Indications:              Screening for colorectal malignant neoplasm Medicines:                Monitored Anesthesia Care Procedure:                Pre-Anesthesia Assessment:                           - Prior to the procedure, a History and Physical                            was performed, and patient medications and                            allergies were reviewed. The patient's tolerance of                            previous anesthesia was also reviewed. The risks                            and benefits of the procedure and the sedation                            options and risks were discussed with the patient.                            All questions were answered, and informed consent                            was obtained. Prior Anticoagulants: The patient has                            taken no previous anticoagulant or antiplatelet                            agents. ASA Grade Assessment: II - A patient with                            mild systemic disease. After reviewing the risks                            and benefits, the patient was deemed in                            satisfactory condition to undergo the procedure.                           After obtaining informed consent, the colonoscope  was passed under direct vision. Throughout the                            procedure, the patient's blood pressure, pulse, and                            oxygen saturations were monitored continuously. The                            Colonoscope was introduced through the anus and                            advanced to the the cecum, identified by                            appendiceal orifice and  ileocecal valve. The                            colonoscopy was performed without difficulty. The                            patient tolerated the procedure well. The quality                            of the bowel preparation was good. The ileocecal                            valve, appendiceal orifice, and rectum were                            photographed. Scope In: 1:31:57 PM Scope Out: 1:42:05 PM Scope Withdrawal Time: 0 hours 7 minutes 20 seconds  Total Procedure Duration: 0 hours 10 minutes 8 seconds  Findings:                 A 6 mm polyp was found in the cecum. The polyp was                            sessile. The polyp was removed with a cold snare.                            Resection and retrieval were complete.                           Multiple small and large-mouthed diverticula were                            found in the left colon.                           The exam was otherwise without abnormality on                            direct and retroflexion views. Complications:  No immediate complications. Estimated blood loss:                            None. Estimated Blood Loss:     Estimated blood loss: none. Impression:               - One 6 mm polyp in the cecum, removed with a cold                            snare. Resected and retrieved.                           - Diverticulosis in the left colon.                           - The examination was otherwise normal on direct                            and retroflexion views. Recommendation:           - Patient has a contact number available for                            emergencies. The signs and symptoms of potential                            delayed complications were discussed with the                            patient. Return to normal activities tomorrow.                            Written discharge instructions were provided to the                            patient.                           -  Resume previous diet.                           - Continue present medications.                           - Repeat colonoscopy is recommended. The                            colonoscopy date will be determined after pathology                            results from today's exam become available for                            review. Likely 7-10 years. Milus Banister, MD 07/02/2019 1:48:01 PM This report has been signed electronically.

## 2019-07-02 NOTE — Progress Notes (Signed)
Pt's states no medical or surgical changes since previsit or office visit. 

## 2019-07-02 NOTE — Patient Instructions (Signed)
Read all handouts given to you by your recovery room nurse.  Thank-you for choosing [place] for your healthcare needs today.  YOU HAD AN ENDOSCOPIC PROCEDURE TODAY AT [facility]:   Refer to the procedure report that was given to you for any specific questions about what was found during the examination.  If the procedure report does not answer your questions, please call your gastroenterologist to clarify.  If you requested that your care partner not be given the details of your procedure findings, then the procedure report has been included in a sealed envelope for you to review at your convenience later.  YOU SHOULD EXPECT: Some feelings of bloating in the abdomen. Passage of more gas than usual.  Walking can help get rid of the air that was put into your GI tract during the procedure and reduce the bloating. If you had a lower endoscopy (such as a colonoscopy or flexible sigmoidoscopy) you may notice spotting of blood in your stool or on the toilet paper. If you underwent a bowel prep for your procedure, you may not have a normal bowel movement for a few days.  Please Note:  You might notice some irritation and congestion in your nose or some drainage.  This is from the oxygen used during your procedure.  There is no need for concern and it should clear up in a day or so.  SYMPTOMS TO REPORT IMMEDIATELY:   Following lower endoscopy (colonoscopy or flexible sigmoidoscopy):  Excessive amounts of blood in the stool  Significant tenderness or worsening of abdominal pains  Swelling of the abdomen that is new, acute  Fever of 100F or higher   For urgent or emergent issues, a gastroenterologist can be reached at any hour by calling [phone].   DIET:  We do recommend a small meal at first, but then you may proceed to your regular diet.  Drink plenty of fluids but you should avoid alcoholic beverages for 24 hours. Try to eat a high fiber diet, and drink plenty of  water.  ACTIVITY:  You should plan to take it easy for the rest of today and you should NOT DRIVE or use heavy machinery until tomorrow (because of the sedation medicines used during the test).    FOLLOW UP: Our staff will call the number listed on your records 48-72 hours following your procedure to check on you and address any questions or concerns that you may have regarding the information given to you following your procedure. If we do not reach you, we will leave a message.  We will attempt to reach you two times.  During this call, we will ask if you have developed any symptoms of COVID 19. If you develop any symptoms (ie: fever, flu-like symptoms, shortness of breath, cough etc.) before then, please call [phone].  If you test positive for Covid 19 in the 2 weeks post procedure, please call and report this information to [place].    If any biopsies were taken you will be contacted by phone or by letter within the next 1-3 weeks.  Please call us at [phone] if you have not heard about the biopsies in 3 weeks.    SIGNATURES/CONFIDENTIALITY: You and/or your care partner have signed paperwork which will be entered into your electronic medical record.  These signatures attest to the fact that that the information above on your After Visit Summary has been reviewed and is understood.  Full responsibility of the confidentiality of this discharge  information lies with you and/or your care-partner. 

## 2019-07-02 NOTE — Progress Notes (Signed)
Report given to PACU, vss 

## 2019-07-04 ENCOUNTER — Other Ambulatory Visit: Payer: Self-pay | Admitting: Primary Care

## 2019-07-04 ENCOUNTER — Telehealth: Payer: Self-pay

## 2019-07-04 DIAGNOSIS — I10 Essential (primary) hypertension: Secondary | ICD-10-CM

## 2019-07-04 NOTE — Telephone Encounter (Signed)
Called [phone] and left a messaged we tried to reach pt for a follow up call. maw

## 2019-07-04 NOTE — Telephone Encounter (Signed)
Called [phone] and left a messaged we tried to reach pt for a follow up call. maw

## 2019-07-09 ENCOUNTER — Encounter: Payer: Self-pay | Admitting: Gastroenterology

## 2019-08-12 ENCOUNTER — Other Ambulatory Visit: Payer: Self-pay | Admitting: Primary Care

## 2019-08-12 DIAGNOSIS — E559 Vitamin D deficiency, unspecified: Secondary | ICD-10-CM

## 2019-08-13 NOTE — Telephone Encounter (Signed)
Last prescribed on 05/22/2019 . Last appointment on 05/22/2019 (CPE). No future appointment

## 2019-08-13 NOTE — Telephone Encounter (Signed)
Needs repeat vitamin D lab which is preordered. Will you please notify patient of this and schedule a lab only appointment? Is she taking her vitamin D weekly still?

## 2019-08-14 NOTE — Telephone Encounter (Signed)
Left message for patient to call back  

## 2019-08-14 NOTE — Telephone Encounter (Signed)
Patient is taking Vitamin D once a week still. Appointment made for lab re check on 08/23/2019 and also she scheduled nurse visit for that day to get Shingrix #2 and Flu shot. I advised patient that medication may not get refilled until we get her levels back.

## 2019-08-14 NOTE — Telephone Encounter (Signed)
Noted, await lab results.

## 2019-08-23 ENCOUNTER — Other Ambulatory Visit (INDEPENDENT_AMBULATORY_CARE_PROVIDER_SITE_OTHER): Payer: Managed Care, Other (non HMO)

## 2019-08-23 ENCOUNTER — Ambulatory Visit (INDEPENDENT_AMBULATORY_CARE_PROVIDER_SITE_OTHER): Payer: Managed Care, Other (non HMO)

## 2019-08-23 DIAGNOSIS — Z23 Encounter for immunization: Secondary | ICD-10-CM

## 2019-08-23 DIAGNOSIS — E559 Vitamin D deficiency, unspecified: Secondary | ICD-10-CM | POA: Diagnosis not present

## 2019-08-23 LAB — VITAMIN D 25 HYDROXY (VIT D DEFICIENCY, FRACTURES): VITD: 54.25 ng/mL (ref 30.00–100.00)

## 2019-08-24 ENCOUNTER — Other Ambulatory Visit: Payer: Self-pay | Admitting: Primary Care

## 2019-08-24 DIAGNOSIS — M8580 Other specified disorders of bone density and structure, unspecified site: Secondary | ICD-10-CM

## 2019-08-24 NOTE — Assessment & Plan Note (Signed)
Recent vitamin D level stable, continue 50,000 units weekly per patient preference.

## 2019-08-27 ENCOUNTER — Other Ambulatory Visit: Payer: Self-pay | Admitting: Primary Care

## 2019-08-27 DIAGNOSIS — E559 Vitamin D deficiency, unspecified: Secondary | ICD-10-CM

## 2019-08-27 MED ORDER — VITAMIN D (ERGOCALCIFEROL) 1.25 MG (50000 UNIT) PO CAPS: ORAL_CAPSULE | ORAL | 1 refills | Status: DC

## 2019-10-31 ENCOUNTER — Other Ambulatory Visit: Payer: Self-pay | Admitting: Pulmonary Disease

## 2019-10-31 ENCOUNTER — Other Ambulatory Visit: Payer: Self-pay | Admitting: Surgery

## 2019-10-31 ENCOUNTER — Other Ambulatory Visit: Payer: Self-pay | Admitting: Primary Care

## 2019-10-31 DIAGNOSIS — Z1231 Encounter for screening mammogram for malignant neoplasm of breast: Secondary | ICD-10-CM

## 2019-12-19 ENCOUNTER — Ambulatory Visit
Admission: RE | Admit: 2019-12-19 | Discharge: 2019-12-19 | Disposition: A | Discharge status: Home / Self Care | Payer: Managed Care, Other (non HMO) | Source: Ambulatory Visit | Attending: Primary Care | Admitting: Primary Care

## 2019-12-19 ENCOUNTER — Other Ambulatory Visit: Payer: Self-pay

## 2019-12-19 DIAGNOSIS — Z1231 Encounter for screening mammogram for malignant neoplasm of breast: Secondary | ICD-10-CM

## 2020-04-13 ENCOUNTER — Other Ambulatory Visit: Payer: Self-pay | Admitting: Primary Care

## 2020-04-13 DIAGNOSIS — I10 Essential (primary) hypertension: Secondary | ICD-10-CM

## 2020-07-04 ENCOUNTER — Telehealth: Payer: Self-pay

## 2020-07-04 NOTE — Telephone Encounter (Signed)
[facility] - Client TELEPHONE ADVICE RECORD [facility] Patient Name: Madison [name] Gender: Female DOB: [birth year] Age: 64 Y 12 M Return Phone Number: [phone] (Primary), [phone] (Secondary) Address: [name] [address] Client [facility] - Client Client Site [facility] - Day Physician Alma Friendly - NP Contact Type Call Who Is Calling Patient / Member / Family / Caregiver Call Type Triage / Clinical Relationship To Patient Self Return Phone Number [phone] (Primary) Chief Complaint Headache Reason for Call Symptomatic / Request for [place] states she has been experiencing headaches, fatigue and feelings of passing out. Translation No Nurse Assessment Nurse: Ysidro Evert, RN, Madison [name] Date/Time (Eastern Time): 07/04/2020 1:32:33 PM Confirm and document reason for call. If symptomatic, describe symptoms. ---Caller states she has a headache and fatigue and some dizziness. Her bp was 103/69 Has the patient had close contact with a person known or suspected to have the novel coronavirus illness OR traveled / lives in area with major community spread (including international travel) in the last 14 days from the onset of symptoms? * If Asymptomatic, screen for exposure and travel within the last 14 days. ---No Does the patient have any new or worsening symptoms? ---Yes Will a triage be completed? ---Yes Related visit to physician within the last 2 weeks? ---No Does the PT have any chronic conditions? (i.e. diabetes, asthma, this includes High risk factors for pregnancy, etc.) ---Yes List chronic conditions. ---hypertension Is this a behavioral health or substance abuse call? ---No Guidelines Guideline Title Affirmed Question Affirmed Notes Nurse Date/Time (Eastern Time) Blood Pressure - Low [8] Fall in systolic BP > 20 mm Hg from normal  AND [2] dizzy, lightheaded, or weak Ysidro Evert, RN, Madison [name] 07/04/2020 1:36:23 PM Disp. Time Eilene Ghazi Time) Disposition Final User 07/04/2020 1:44:07 PM Go to ED Now (or PCP triage) Yes Ysidro Evert, RN, Madison [name] PLEASE NOTE: All timestamps contained within this report are represented as [place] Standard Time. CONFIDENTIALTY NOTICE: This fax transmission is intended only for the addressee. It contains information that is legally privileged, confidential or otherwise protected from use or disclosure. If you are not the intended recipient, you are strictly prohibited from reviewing, disclosing, copying using or disseminating any of this information or taking any action in reliance on or regarding this information. If you have received this fax in error, please notify us immediately by telephone so that we can arrange for its return to [place]. Phone: [phone], Toll-Free: [phone], Fax: [phone] Page: 2 of 2 Call Id: [id number] [place] Disagree/Comply Disagree Caller Understands Yes PreDisposition Did not know what to do Care Advice Given Per Guideline GO TO ED NOW (OR PCP TRIAGE): * IF NO PCP (PRIMARY CARE PROVIDER) SECOND-LEVEL TRIAGE: You need to be seen within the next hour. Go to the [facility] at _____________ [place] as soon as you can. * It is better and safer if another adult drives instead of you. CARE ADVICE given per Low Blood Pressure (Adult) guideline. Referrals GO TO FACILITY REFUSED

## 2020-07-04 NOTE — Telephone Encounter (Signed)
Does not feel well. Fatigue. Headache at times. Has been going on about a week. BP was 103/69. She is on BP meds. She was tested for Covid a few days ago just to make sure even though she is fully vaccinated. It was negative. I made her an appt with Anda Kraft on 07-10-20 for fatigue. If her symptoms change or worsen, she will go somewhere to be seen.

## 2020-07-04 NOTE — Telephone Encounter (Signed)
Unable to reach pt at either contact # and per this note pt already has appt to see Gentry Fitz NP on 07/10/20. Per same note if pt condition changes or worsens, pt will go somewhere to be seen. FYI to Gentry Fitz NP and Vallarie Mare  CMA.

## 2020-07-10 ENCOUNTER — Other Ambulatory Visit: Payer: Self-pay

## 2020-07-10 ENCOUNTER — Encounter: Payer: Self-pay | Admitting: Primary Care

## 2020-07-10 ENCOUNTER — Ambulatory Visit: Payer: Managed Care, Other (non HMO) | Admitting: Primary Care

## 2020-07-10 VITALS — BP 126/80 | HR 68 | Temp 96.7°F | Ht 63.0 in | Wt 166.2 lb

## 2020-07-10 DIAGNOSIS — R5383 Other fatigue: Secondary | ICD-10-CM | POA: Diagnosis not present

## 2020-07-10 DIAGNOSIS — E785 Hyperlipidemia, unspecified: Secondary | ICD-10-CM

## 2020-07-10 DIAGNOSIS — R7303 Prediabetes: Secondary | ICD-10-CM | POA: Diagnosis not present

## 2020-07-10 DIAGNOSIS — E559 Vitamin D deficiency, unspecified: Secondary | ICD-10-CM

## 2020-07-10 DIAGNOSIS — I10 Essential (primary) hypertension: Secondary | ICD-10-CM

## 2020-07-10 DIAGNOSIS — E538 Deficiency of other specified B group vitamins: Secondary | ICD-10-CM | POA: Diagnosis not present

## 2020-07-10 LAB — BASIC METABOLIC PANEL
BUN: 10 mg/dL (ref 6–23)
CO2: 27 mEq/L (ref 19–32)
Calcium: 9.2 mg/dL (ref 8.4–10.5)
Chloride: 109 mEq/L (ref 96–112)
Creatinine, Ser: 0.65 mg/dL (ref 0.40–1.20)
GFR: 111.09 mL/min (ref >60.00)
Glucose, Bld: 98 mg/dL (ref 70–99)
Potassium: 4.2 mEq/L (ref 3.5–5.1)
Sodium: 142 mEq/L (ref 135–145)

## 2020-07-10 LAB — VITAMIN D 25 HYDROXY (VIT D DEFICIENCY, FRACTURES): VITD: 25.62 ng/mL — ABNORMAL LOW (ref 30.00–100.00)

## 2020-07-10 LAB — CBC
HCT: 39 % (ref 36.0–46.0)
Hemoglobin: 12.7 g/dL (ref 12.0–15.0)
MCHC: 32.7 g/dL (ref 30.0–36.0)
MCV: 90.3 fl (ref 78.0–100.0)
Platelets: 183 10*3/uL (ref 150.0–400.0)
RBC: 4.32 Mil/uL (ref 3.87–5.11)
RDW: 15 % (ref 11.5–15.5)
WBC: 5.4 10*3/uL (ref 4.0–10.5)

## 2020-07-10 LAB — LIPID PANEL
Cholesterol: 207 mg/dL — ABNORMAL HIGH (ref 0–200)
HDL: 58.5 mg/dL (ref >39.00)
LDL Cholesterol: 136 mg/dL — ABNORMAL HIGH (ref 0–99)
NonHDL: 148.87
Total CHOL/HDL Ratio: 4
Triglycerides: 62 mg/dL (ref 0.0–149.0)
VLDL: 12.4 mg/dL (ref 0.0–40.0)

## 2020-07-10 LAB — HEMOGLOBIN A1C: Hgb A1c MFr Bld: 5.7 % (ref 4.6–6.5)

## 2020-07-10 LAB — VITAMIN B12: Vitamin B-12: 229 pg/mL (ref 211–911)

## 2020-07-10 LAB — TSH: TSH: 1.97 u[IU]/mL (ref 0.35–4.50)

## 2020-07-10 MED ORDER — ATORVASTATIN CALCIUM 10 MG PO TABS: ORAL_TABLET | ORAL | 3 refills | Status: DC

## 2020-07-10 NOTE — Assessment & Plan Note (Signed)
Strong FH of diabetes, repeat A1C pending. She does have some symptoms including polyphagia and polydipsia.

## 2020-07-10 NOTE — Patient Instructions (Signed)
Stop by the lab prior to leaving today. I will notify you of your results once received.   It was a pleasure to see you today!  

## 2020-07-10 NOTE — Assessment & Plan Note (Signed)
Not currently on vitamin B12. Repeat levels pending.

## 2020-07-10 NOTE — Progress Notes (Signed)
Subjective:    Patient ID: Madison [name], female    DOB: 08-31-1956, 64 y.o.   MRN: [id number]  HPI  This visit occurred during the SARS-CoV-2 public health emergency.  Safety protocols were in place, including screening questions prior to the visit, additional usage of staff PPE, and extensive cleaning of exam room while observing appropriate contact time as indicated for disinfecting solutions.   Madison [name] is a 64 year old female with a history of hypertension, osteoarthritis, glaucoma, prediabetes, hyperlipidemia who presents today with a chief complaint of fatigue.  Symptoms include "feeling woozy", weaker than usual, increased hunger, nausea that improves after eating, hot sweats that began 2 weeks ago. She has a strong family history of diabetes in numerous family members. She took a Covid-19 test last week which was negative.   She's checking her BP at home over the last several days which were 131/85, 130/82. She denies snoring, anxiety, depression, more sedentary lifestyle.   BP Readings from Last 3 Encounters:  07/10/20 126/80  07/02/19 128/82  05/22/19 128/72     Review of Systems  Constitutional: Positive for fatigue.  Eyes: Negative for visual disturbance.  Respiratory: Negative for shortness of breath.   Cardiovascular: Negative for chest pain.  Gastrointestinal: Negative for abdominal pain and blood in stool.  Endocrine: Positive for polydipsia and polyphagia. Negative for polyuria.  Neurological: Positive for weakness, light-headedness and headaches.       Past Medical History:  Diagnosis Date  . Arthritis   . Borderline diabetes   . Chicken pox   . Glaucoma   . Heart murmur   . Hypertension   . Osteopenia   . Seasonal allergies      Social History   Socioeconomic History  . Marital status: Married    Spouse name: Not on file  . Number of children: Not on file  . Years of education: Not on file  . Highest education level: Not on file  Occupational  History  . Not on file  Tobacco Use  . Smoking status: Never Smoker  . Smokeless tobacco: Never Used  Vaping Use  . Vaping Use: Never used  Substance and Sexual Activity  . Alcohol use: No    Alcohol/week: 0.0 standard drinks  . Drug use: No  . Sexual activity: Not on file  Other Topics Concern  . Not on file  Social History Narrative   Married.   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   Social Determinants of Health   Financial Resource Strain:   . Difficulty of Paying Living Expenses: Not on file  Food Insecurity:   . Worried About Charity fundraiser in the Last Year: Not on file  . Ran Out of Food in the Last Year: Not on file  Transportation Needs:   . Lack of Transportation (Medical): Not on file  . Lack of Transportation (Non-Medical): Not on file  Physical Activity:   . Days of Exercise per Week: Not on file  . Minutes of Exercise per Session: Not on file  Stress:   . Feeling of Stress : Not on file  Social Connections:   . Frequency of Communication with Friends and Family: Not on file  . Frequency of Social Gatherings with Friends and Family: Not on file  . Attends Religious Services: Not on file  . Active Member of Clubs or Organizations: Not on file  . Attends Archivist Meetings: Not  on file  . Marital Status: Not on file  Intimate Partner Violence:   . Fear of Current or Ex-Partner: Not on file  . Emotionally Abused: Not on file  . Physically Abused: Not on file  . Sexually Abused: Not on file    Past Surgical History:  Procedure Laterality Date  . BREAST BIOPSY Right 11/24/2017   rt breast stereo, benign  . COLONOSCOPY  12/02/2006  . TONSILLECTOMY AND ADENOIDECTOMY  1961    Family History  Problem Relation Age of Onset  . Arthritis Mother   . Hyperlipidemia Mother   . Heart disease Mother   . Stroke Mother   . Hypertension Mother   . Diabetes Father   . Diabetes Brother     . Arthritis Maternal Grandmother   . Diabetes Paternal Grandmother   . Esophageal cancer Neg Hx   . Colon cancer Neg Hx   . Colon polyps Neg Hx   . Rectal cancer Neg Hx   . Stomach cancer Neg Hx     No Known Allergies  Current Outpatient Medications on File Prior to Visit  Medication Sig Dispense Refill  . atorvastatin (LIPITOR) 10 MG tablet TAKE 1 TABLET BY MOUTH EVERY DAY IN THE EVENING FOR CHOLESTEROL 90 tablet 3  . CALCIUM PO Take by mouth.    . dorzolamide (TRUSOPT) 2 % ophthalmic solution 1 drop 2 (two) times daily.    Marland Kitchen levocetirizine (XYZAL) 5 MG tablet Take 5 mg by mouth every evening.    Marland Kitchen losartan (COZAAR) 100 MG tablet Take 1 tablet (100 mg total) by mouth daily. For blood pressure. NEED APPOINTMENT FOR ANY MORE REFILLS 90 tablet 0  . timolol (TIMOPTIC) 0.25 % ophthalmic solution USE 1 DROP(S) IN BOTH EYES ONCE IN THE MORNING FOR 30 DAYS  6  . Turmeric (QC TUMERIC COMPLEX PO) Take by mouth. Instaflex joint care    . vitamin C (ASCORBIC ACID) 500 MG tablet Take 500 mg by mouth daily.    Marland Kitchen LUMIGAN 0.01 % SOLN INSTILL ONE DROP INTO BOTH EYES AT BEDTIME (Patient not taking: Reported on 07/10/2020)  6  . Vitamin D, Ergocalciferol, (DRISDOL) 1.25 MG (50000 UT) CAPS capsule Take 1 capsule by mouth once weekly. (Patient not taking: Reported on 07/10/2020) 12 capsule 1   No current facility-administered medications on file prior to visit.    BP 126/80   Pulse 68   Temp (!) 96.7 F (35.9 C) (Temporal)   Ht 5\' 3"  (1.6 m)   Wt 166 lb 4 oz (75.4 kg)   SpO2 98%   BMI 29.45 kg/m    Objective:   Physical Exam Eyes:     Extraocular Movements: Extraocular movements intact.  Cardiovascular:     Rate and Rhythm: Normal rate and regular rhythm.  Pulmonary:     Effort: Pulmonary effort is normal.     Breath sounds: Normal breath sounds.  Musculoskeletal:        General: Normal range of motion.     Cervical back: Neck supple.  Skin:    General: Skin is warm and dry.   Neurological:     Mental Status: She is alert and oriented to person, place, and time.     Cranial Nerves: No cranial nerve deficit.     Deep Tendon Reflexes:     Reflex Scores:      Patellar reflexes are 2+ on the right side and 2+ on the left side.  Assessment & Plan:

## 2020-07-10 NOTE — Assessment & Plan Note (Signed)
Compliant to atorvastatin, refills provided. Repeat lipids pending.

## 2020-07-10 NOTE — Assessment & Plan Note (Signed)
Not currently taking supplements. Repeat Vitamin D pending.

## 2020-07-10 NOTE — Assessment & Plan Note (Signed)
Acute for the last several weeks. No significant changes.  Checking labs today including A1C, TSH, CBC, vitamin levels.   Doesn't appear to be secondary to anxiety/depression, OSA. Await labs.

## 2020-07-10 NOTE — Assessment & Plan Note (Signed)
Well controlled in the office today.  Continue losartan 100 mg.

## 2020-07-15 NOTE — Telephone Encounter (Signed)
This encounter was created in error - please disregard.

## 2020-08-07 ENCOUNTER — Ambulatory Visit: Payer: Managed Care, Other (non HMO) | Admitting: Physician Assistant

## 2020-08-07 ENCOUNTER — Other Ambulatory Visit: Payer: Self-pay

## 2020-08-07 VITALS — BP 146/78 | HR 80 | Temp 98.4°F | Resp 16 | Ht 63.0 in | Wt 168.0 lb

## 2020-08-07 DIAGNOSIS — H6983 Other specified disorders of Eustachian tube, bilateral: Secondary | ICD-10-CM

## 2020-08-07 NOTE — Patient Instructions (Signed)
Eustachian Tube Dysfunction ° °Eustachian tube dysfunction refers to a condition in which a blockage develops in the narrow passage that connects the middle ear to the back of the nose (eustachian tube). The eustachian tube regulates air pressure in the middle ear by letting air move between the ear and nose. It also helps to drain fluid from the middle ear space. °Eustachian tube dysfunction can affect one or both ears. When the eustachian tube does not function properly, air pressure, fluid, or both can build up in the middle ear. °What are the causes? °This condition occurs when the eustachian tube becomes blocked or cannot open normally. Common causes of this condition include: °· Ear infections. °· Colds and other infections that affect the nose, mouth, and throat (upper respiratory tract). °· Allergies. °· Irritation from cigarette smoke. °· Irritation from stomach acid coming up into the esophagus (gastroesophageal reflux). The esophagus is the tube that carries food from the mouth to the stomach. °· Sudden changes in air pressure, such as from descending in an airplane or scuba diving. °· Abnormal growths in the nose or throat, such as: °? Growths that line the nose (nasal polyps). °? Abnormal growth of cells (tumors). °? Enlarged tissue at the back of the throat (adenoids). °What increases the risk? °You are more likely to develop this condition if: °· You smoke. °· You are overweight. °· You are a child who has: °? Certain birth defects of the mouth, such as cleft palate. °? Large tonsils or adenoids. °What are the signs or symptoms? °Common symptoms of this condition include: °· A feeling of fullness in the ear. °· Ear pain. °· Clicking or popping noises in the ear. °· Ringing in the ear. °· Hearing loss. °· Loss of balance. °· Dizziness. °Symptoms may get worse when the air pressure around you changes, such as when you travel to an area of high elevation, fly on an airplane, or go scuba diving. °How is  this diagnosed? °This condition may be diagnosed based on: °· Your symptoms. °· A physical exam of your ears, nose, and throat. °· Tests, such as those that measure: °? The movement of your eardrum (tympanogram). °? Your hearing (audiometry). °How is this treated? °Treatment depends on the cause and severity of your condition. °· In mild cases, you may relieve your symptoms by moving air into your ears. This is called "popping the ears." °· In more severe cases, or if you have symptoms of fluid in your ears, treatment may include: °? Medicines to relieve congestion (decongestants). °? Medicines that treat allergies (antihistamines). °? Nasal sprays or ear drops that contain medicines that reduce swelling (steroids). °? A procedure to drain the fluid in your eardrum (myringotomy). In this procedure, a small tube is placed in the eardrum to: °§ Drain the fluid. °§ Restore the air in the middle ear space. °? A procedure to insert a balloon device through the nose to inflate the opening of the eustachian tube (balloon dilation). °Follow these instructions at home: °Lifestyle °· Do not do any of the following until your health care provider approves: °? Travel to high altitudes. °? Fly in airplanes. °? Work in a pressurized cabin or room. °? Scuba dive. °· Do not use any products that contain nicotine or tobacco, such as cigarettes and e-cigarettes. If you need help quitting, ask your health care provider. °· Keep your ears dry. Wear fitted earplugs during showering and bathing. Dry your ears completely after. °General instructions °· Take over-the-counter   and prescription medicines only as told by your health care provider. °· Use techniques to help pop your ears as recommended by your health care provider. These may include: °? Chewing gum. °? Yawning. °? Frequent, forceful swallowing. °? Closing your mouth, holding your nose closed, and gently blowing as if you are trying to blow air out of your nose. °· Keep all  follow-up visits as told by your health care provider. This is important. °Contact a health care provider if: °· Your symptoms do not go away after treatment. °· Your symptoms come back after treatment. °· You are unable to pop your ears. °· You have: °? A fever. °? Pain in your ear. °? Pain in your head or neck. °? Fluid draining from your ear. °· Your hearing suddenly changes. °· You become very dizzy. °· You lose your balance. °Summary °· Eustachian tube dysfunction refers to a condition in which a blockage develops in the eustachian tube. °· It can be caused by ear infections, allergies, inhaled irritants, or abnormal growths in the nose or throat. °· Symptoms include ear pain, hearing loss, or ringing in the ears. °· Mild cases are treated with maneuvers to unblock the ears, such as yawning or ear popping. °· Severe cases are treated with medicines. Surgery may also be done (rare). °This information is not intended to replace advice given to you by your health care provider. Make sure you discuss any questions you have with your health care provider. °Document Revised: 02/21/2018 Document Reviewed: 02/21/2018 °Elsevier Patient Education © 2020 [employer] ° °

## 2020-08-07 NOTE — Progress Notes (Signed)
Subjective:    Patient ID: Madison [name], female    DOB: January 09, 1956, 64 y.o.   MRN: [id number]  HPI Madison [name] is a 64 yo AAF who presents with intermittent episodes of pressure and pain in her bilateral ears, positional discomfort (aggravated with bending from waist and reaching for shoes), "swimmy head", mild fatigue for the past month. She has a long history of seasonal allergies. Remembers a distinct flare when ragweed erupted about the same time. Takes Xyzal daily and has for a long time Occasionally uses nasal spray- believes the last was Nasocort. Always has the sniffles "known as the lady with tissues"  She has been working , without needing time off Drove herself here from her office. Retired but working over at [employer] vaccine x 2 and was tested on 19 Sept -negative  She saw her PCP about a week ago and lab tests were normal or minimally off with Vitamin D and Vitamin B, which have since  been supplemented with po Rx.   VS and BP were WNL  Madison [name] is remembering that as her mother aged she developed inner ear issues. She is anxious that she herself may have developed an infection and wishes to be checked.  Review of Systems Post nasal drip mild to moderate even with Xyzal on board. Skipping Rx results in very symptomatic day- makes a point not to miss  Mothers symptoms on review probably c/w tiny vessel changes and resultant tinnitus or sense of dis-ease with balance.     Objective:   Physical Exam Vitals and nursing note reviewed.  Constitutional:      General: She is not in acute distress.    Appearance: Normal appearance.  HENT:     Head: Normocephalic and atraumatic.     Right Ear: Tympanic membrane, ear canal and external ear normal.     Left Ear: Tympanic membrane, ear canal and external ear normal.     Ears:     Comments: TMs bilaterally normal color, no erythema, no fluid level but appear to have mucous behind and appear retracted  No discomfort with  exam  Gentle Valsalva yields sense of reduced pressure but does not accomplish pop or squeak or relief  Uses Qtips routinely    Nose: Nose normal.     Mouth/Throat:     Mouth: Mucous membranes are moist.     Pharynx: No oropharyngeal exudate or posterior oropharyngeal erythema.     Comments: Mild cobblestoning, no visible post nasal drip at this time Eyes:     Extraocular Movements: Extraocular movements intact.     Conjunctiva/sclera: Conjunctivae normal.  Pulmonary:     Effort: Pulmonary effort is normal.     Breath sounds: Normal breath sounds.  Musculoskeletal:        General: Normal range of motion.     Cervical back: Normal range of motion and neck supple.     Comments: Head and neck motion without restriction  Lymphadenopathy:     Cervical: No cervical adenopathy.  Skin:    General: Skin is warm and dry.     Capillary Refill: Capillary refill takes less than 2 seconds.     Comments: Reports very dry skin and uses creams routinely  Neurological:     General: No focal deficit present.     Mental Status: She is alert.     Gait: Gait normal.     Comments: On and off table without assistance Ambulatory steady balanced no support required  Talking and moving head and neck without limitation Or onset noticeable balance issues         Assessment & Plan:  Eustachian Tube Dysfunction Seasonal allergies  Plan alternative Rx using Zyrtec( has taken before and likes) daily with Fluticasone nasal  Spray 1 STEN BID for 2-3 days if desired then reduce to daily Reminded some women feel too dry with Zyrtec, if this develops switch to Loratadine OTC - just to find well tolerated. Increased hydration- measured as 6-8 bottles of water per day Daily  steamy shower- does not have a vaporizer- so open bathroom release steam to master bedroom after shower.  May do gentle valsalva , yawn, open mouth when she in warm shower or shortly after- without much pressure- looking for sense of  release or pressure. Reassured that crackles or gentle pop sound can be normal.  Canals are currently clean and clear- may use Q-tips if used daily before there can be any cerumen accumulation  Will plan follow up /re-evaluation on about 2 weeks- to notify in the interim with questions , concerns or need to re-visit.

## 2020-08-18 ENCOUNTER — Other Ambulatory Visit: Payer: Self-pay | Admitting: Primary Care

## 2020-08-18 DIAGNOSIS — I10 Essential (primary) hypertension: Secondary | ICD-10-CM

## 2020-08-19 ENCOUNTER — Telehealth: Payer: Self-pay

## 2020-08-20 NOTE — Telephone Encounter (Signed)
Patient left a voicemail stating that [facility] sent over a request for a refill on her Losartan on Monday. Patient is requesting that this be done today.

## 2020-08-21 ENCOUNTER — Ambulatory Visit: Payer: Self-pay | Admitting: Physician Assistant

## 2020-10-14 ENCOUNTER — Other Ambulatory Visit (HOSPITAL_COMMUNITY)
Admission: RE | Admit: 2020-10-14 | Discharge: 2020-10-14 | Disposition: A | Discharge status: Home / Self Care | Payer: Managed Care, Other (non HMO) | Source: Ambulatory Visit | Attending: Internal Medicine | Admitting: Internal Medicine

## 2020-10-14 ENCOUNTER — Ambulatory Visit (INDEPENDENT_AMBULATORY_CARE_PROVIDER_SITE_OTHER): Payer: Managed Care, Other (non HMO) | Admitting: Primary Care

## 2020-10-14 ENCOUNTER — Other Ambulatory Visit: Payer: Self-pay

## 2020-10-14 ENCOUNTER — Encounter: Payer: Self-pay | Admitting: Primary Care

## 2020-10-14 VITALS — BP 112/68 | HR 70 | Temp 97.7°F | Ht 63.0 in | Wt 166.0 lb

## 2020-10-14 DIAGNOSIS — R7303 Prediabetes: Secondary | ICD-10-CM

## 2020-10-14 DIAGNOSIS — H409 Unspecified glaucoma: Secondary | ICD-10-CM

## 2020-10-14 DIAGNOSIS — I10 Essential (primary) hypertension: Secondary | ICD-10-CM | POA: Diagnosis not present

## 2020-10-14 DIAGNOSIS — E785 Hyperlipidemia, unspecified: Secondary | ICD-10-CM | POA: Diagnosis not present

## 2020-10-14 DIAGNOSIS — E559 Vitamin D deficiency, unspecified: Secondary | ICD-10-CM | POA: Diagnosis not present

## 2020-10-14 DIAGNOSIS — Z124 Encounter for screening for malignant neoplasm of cervix: Secondary | ICD-10-CM | POA: Insufficient documentation

## 2020-10-14 DIAGNOSIS — Z23 Encounter for immunization: Secondary | ICD-10-CM | POA: Diagnosis not present

## 2020-10-14 DIAGNOSIS — M8580 Other specified disorders of bone density and structure, unspecified site: Secondary | ICD-10-CM

## 2020-10-14 DIAGNOSIS — Z0000 Encounter for general adult medical examination without abnormal findings: Secondary | ICD-10-CM

## 2020-10-14 DIAGNOSIS — R5383 Other fatigue: Secondary | ICD-10-CM

## 2020-10-14 LAB — LIPID PANEL
Cholesterol: 146 mg/dL (ref 0–200)
HDL: 60.2 mg/dL (ref >39.00)
LDL Cholesterol: 75 mg/dL (ref 0–99)
NonHDL: 86.05
Total CHOL/HDL Ratio: 2
Triglycerides: 55 mg/dL (ref 0.0–149.0)
VLDL: 11 mg/dL (ref 0.0–40.0)

## 2020-10-14 LAB — VITAMIN D 25 HYDROXY (VIT D DEFICIENCY, FRACTURES): VITD: 45.32 ng/mL (ref 30.00–100.00)

## 2020-10-14 NOTE — Assessment & Plan Note (Signed)
Compliant to atorvastatin 10 mg, repeat lipids pending.

## 2020-10-14 NOTE — Patient Instructions (Signed)
Stop by the lab prior to leaving today. I will notify you of your results once received.   Start exercising. You should be getting 150 minutes of moderate intensity exercise weekly.  Continue to work on a healthy diet. Ensure you are consuming 64 ounces of water daily.  It was a pleasure to see you today!   Preventive Care 76-64 Years Old, Female Preventive care refers to visits with your health care provider and lifestyle choices that can promote health and wellness. This includes:  A yearly physical exam. This may also be called an annual well check.  Regular dental visits and eye exams.  Immunizations.  Screening for certain conditions.  Healthy lifestyle choices, such as eating a healthy diet, getting regular exercise, not using drugs or products that contain nicotine and tobacco, and limiting alcohol use. What can I expect for my preventive care visit? Physical exam Your health care provider will check your:  Height and weight. This may be used to calculate body mass index (BMI), which tells if you are at a healthy weight.  Heart rate and blood pressure.  Skin for abnormal spots. Counseling Your health care provider may ask you questions about your:  Alcohol, tobacco, and drug use.  Emotional well-being.  Home and relationship well-being.  Sexual activity.  Eating habits.  Work and work Statistician.  Method of birth control.  Menstrual cycle.  Pregnancy history. What immunizations do I need?  Influenza (flu) vaccine  This is recommended every year. Tetanus, diphtheria, and pertussis (Tdap) vaccine  You may need a Td booster every 10 years. Varicella (chickenpox) vaccine  You may need this if you have not been vaccinated. Zoster (shingles) vaccine  You may need this after age 81. Measles, mumps, and rubella (MMR) vaccine  You may need at least one dose of MMR if you were born in 1957 or later. You may also need a second dose. Pneumococcal  conjugate (PCV13) vaccine  You may need this if you have certain conditions and were not previously vaccinated. Pneumococcal polysaccharide (PPSV23) vaccine  You may need one or two doses if you smoke cigarettes or if you have certain conditions. Meningococcal conjugate (MenACWY) vaccine  You may need this if you have certain conditions. Hepatitis A vaccine  You may need this if you have certain conditions or if you travel or work in places where you may be exposed to hepatitis A. Hepatitis B vaccine  You may need this if you have certain conditions or if you travel or work in places where you may be exposed to hepatitis B. Haemophilus influenzae type b (Hib) vaccine  You may need this if you have certain conditions. Human papillomavirus (HPV) vaccine  If recommended by your health care provider, you may need three doses over 6 months. You may receive vaccines as individual doses or as more than one vaccine together in one shot (combination vaccines). Talk with your health care provider about the risks and benefits of combination vaccines. What tests do I need? Blood tests  Lipid and cholesterol levels. These may be checked every 5 years, or more frequently if you are over 22 years old.  Hepatitis C test.  Hepatitis B test. Screening  Lung cancer screening. You may have this screening every year starting at age 39 if you have a 30-pack-year history of smoking and currently smoke or have quit within the past 15 years.  Colorectal cancer screening. All adults should have this screening starting at age 101 and continuing until  age 36. Your health care provider may recommend screening at age 61 if you are at increased risk. You will have tests every 1-10 years, depending on your results and the type of screening test.  Diabetes screening. This is done by checking your blood sugar (glucose) after you have not eaten for a while (fasting). You may have this done every 1-3  years.  Mammogram. This may be done every 1-2 years. Talk with your health care provider about when you should start having regular mammograms. This may depend on whether you have a family history of breast cancer.  BRCA-related cancer screening. This may be done if you have a family history of breast, ovarian, tubal, or peritoneal cancers.  Pelvic exam and Pap test. This may be done every 3 years starting at age 71. Starting at age 59, this may be done every 5 years if you have a Pap test in combination with an HPV test. Other tests  Sexually transmitted disease (STD) testing.  Bone density scan. This is done to screen for osteoporosis. You may have this scan if you are at high risk for osteoporosis. Follow these instructions at home: Eating and drinking  Eat a diet that includes fresh fruits and vegetables, whole grains, lean protein, and low-fat dairy.  Take vitamin and mineral supplements as recommended by your health care provider.  Do not drink alcohol if: ? Your health care provider tells you not to drink. ? You are pregnant, may be pregnant, or are planning to become pregnant.  If you drink alcohol: ? Limit how much you have to 0-1 drink a day. ? Be aware of how much alcohol is in your drink. In the [place] one drink equals one 12 oz bottle of beer (355 mL), one 5 oz glass of wine (148 mL), or one 1 oz glass of hard liquor (44 mL). Lifestyle  Take daily care of your teeth and gums.  Stay active. Exercise for at least 30 minutes on 5 or more days each week.  Do not use any products that contain nicotine or tobacco, such as cigarettes, e-cigarettes, and chewing tobacco. If you need help quitting, ask your health care provider.  If you are sexually active, practice safe sex. Use a condom or other form of birth control (contraception) in order to prevent pregnancy and STIs (sexually transmitted infections).  If told by your health care provider, take low-dose aspirin daily  starting at age 105. What's next?  Visit your health care provider once a year for a well check visit.  Ask your health care provider how often you should have your eyes and teeth checked.  Stay up to date on all vaccines. This information is not intended to replace advice given to you by your health care provider. Make sure you discuss any questions you have with your health care provider. Document Revised: 07/13/2018 Document Reviewed: 07/13/2018 Elsevier Patient Education  2020 [employer].

## 2020-10-14 NOTE — Assessment & Plan Note (Signed)
Compliant to calcium and vitamin D. Encouraged weight bearing exercise.   Bone density scan due next year.

## 2020-10-14 NOTE — Assessment & Plan Note (Signed)
Improved, denies concerns now.

## 2020-10-14 NOTE — Assessment & Plan Note (Signed)
Flu vaccination provided today. Other immunizations UTD.  Mammogram UTD. Bone density scan due next year. Colonoscopy UTD, due in 2027.  Discussed the importance of a healthy diet and regular exercise in order for weight loss, and to reduce the risk of any potential medical problems.  Exam today unremarkable. Labs reviewed and pending.

## 2020-10-14 NOTE — Assessment & Plan Note (Signed)
Compliant to vitamin D 1000 IU once daily. Repeat vitamin D pending. She would like the 50,000 IU weekly.

## 2020-10-14 NOTE — Assessment & Plan Note (Signed)
A1C from August stable from prior years.  Continue to monitor.

## 2020-10-14 NOTE — Progress Notes (Signed)
Subjective:    Patient ID: Dionne Milo, female    DOB: 1956-04-16, 64 y.o.   MRN: [id number]  HPI  This visit occurred during the SARS-CoV-2 public health emergency.  Safety protocols were in place, including screening questions prior to the visit, additional usage of staff PPE, and extensive cleaning of exam room while observing appropriate contact time as indicated for disinfecting solutions.   Ms. Maret is a 64 year old female who presents today for complete physical.  Immunizations: -Tetanus: Completed in 2017 -Influenza: Due today -Shingles: Completed Shingrix -Pneumonia: Completed Pneumovax in 2006 -Covid-19: Completed series   Diet: She endorses a fair diet.  Exercise: She is not exercising, she is active  Eye exam: Completes regularly  Dental exam: Completes semi-annually   Pap Smear: Completed in 2019, abnormal with HPV Mammogram: Completed in 2021 Colonoscopy: Completed in 2020, due in 2027 Hep C Screen:  BP Readings from Last 3 Encounters:  10/14/20 112/68  08/07/20 (!) 146/78  07/10/20 126/80    The 10-year ASCVD risk score Mikey Bussing DC Jr., et al., 2013) is: 6.3%   Values used to calculate the score:     Age: 63 years     Sex: Female     Is Non-Hispanic African American: Yes     Diabetic: No     Tobacco smoker: No     Systolic Blood Pressure: 500 mmHg     Is BP treated: Yes     HDL Cholesterol: 58.5 mg/dL     Total Cholesterol: 207 mg/dL   Review of Systems  Constitutional: Negative for unexpected weight change.  HENT: Negative for rhinorrhea.   Eyes: Negative for visual disturbance.       Glaucoma history, follows with ophthalmology   Respiratory: Negative for cough and shortness of breath.   Cardiovascular: Negative for chest pain.  Gastrointestinal: Negative for constipation and diarrhea.  Genitourinary: Negative for difficulty urinating.  Musculoskeletal: Positive for arthralgias.  Skin: Negative for rash.  Allergic/Immunologic: Negative for  environmental allergies.  Neurological: Negative for dizziness and headaches.  Hematological: Negative for adenopathy.  Psychiatric/Behavioral: The patient is not nervous/anxious.        Past Medical History:  Diagnosis Date  . Arthritis   . Borderline diabetes   . Chicken pox   . Glaucoma   . Heart murmur   . Hypertension   . Osteopenia   . Seasonal allergies      Social History   Socioeconomic History  . Marital status: Married    Spouse name: Not on file  . Number of children: Not on file  . Years of education: Not on file  . Highest education level: Not on file  Occupational History  . Not on file  Tobacco Use  . Smoking status: Never Smoker  . Smokeless tobacco: Never Used  Vaping Use  . Vaping Use: Never used  Substance and Sexual Activity  . Alcohol use: No    Alcohol/week: 0.0 standard drinks  . Drug use: No  . Sexual activity: Not on file  Other Topics Concern  . Not on file  Social History Narrative   Married.   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   Social Determinants of Health   Financial Resource Strain:   . Difficulty of Paying Living Expenses: Not on file  Food Insecurity:   . Worried About Charity fundraiser in the Last Year: Not on file  . Ran Out  of Food in the Last Year: Not on file  Transportation Needs:   . Lack of Transportation (Medical): Not on file  . Lack of Transportation (Non-Medical): Not on file  Physical Activity:   . Days of Exercise per Week: Not on file  . Minutes of Exercise per Session: Not on file  Stress:   . Feeling of Stress : Not on file  Social Connections:   . Frequency of Communication with Friends and Family: Not on file  . Frequency of Social Gatherings with Friends and Family: Not on file  . Attends Religious Services: Not on file  . Active Member of Clubs or Organizations: Not on file  . Attends Archivist Meetings: Not on file  .  Marital Status: Not on file  Intimate Partner Violence:   . Fear of Current or Ex-Partner: Not on file  . Emotionally Abused: Not on file  . Physically Abused: Not on file  . Sexually Abused: Not on file    Past Surgical History:  Procedure Laterality Date  . BREAST BIOPSY Right 11/24/2017   rt breast stereo, benign  . COLONOSCOPY  12/02/2006  . TONSILLECTOMY AND ADENOIDECTOMY  1961    Family History  Problem Relation Age of Onset  . Arthritis Mother   . Hyperlipidemia Mother   . Heart disease Mother   . Stroke Mother   . Hypertension Mother   . Diabetes Father   . Diabetes Brother   . Arthritis Maternal Grandmother   . Diabetes Paternal Grandmother   . Esophageal cancer Neg Hx   . Colon cancer Neg Hx   . Colon polyps Neg Hx   . Rectal cancer Neg Hx   . Stomach cancer Neg Hx     No Known Allergies  Current Outpatient Medications on File Prior to Visit  Medication Sig Dispense Refill  . atorvastatin (LIPITOR) 10 MG tablet TAKE 1 TABLET BY MOUTH EVERY DAY IN THE EVENING FOR CHOLESTEROL 90 tablet 3  . bimatoprost (LUMIGAN) 0.03 % ophthalmic solution 1 drop at bedtime.    Marland Kitchen CALCIUM PO Take by mouth.    . levocetirizine (XYZAL) 5 MG tablet Take 5 mg by mouth every evening.    Marland Kitchen losartan (COZAAR) 100 MG tablet TAKE 1 TABLET EVERY DAY (NEED APPT FOR REFILLS) 90 tablet 1  . LUMIGAN 0.01 % SOLN INSTILL ONE DROP INTO BOTH EYES AT BEDTIME  6  . timolol (TIMOPTIC) 0.25 % ophthalmic solution USE 1 DROP(S) IN BOTH EYES ONCE IN THE MORNING FOR 30 DAYS  6  . Turmeric (QC TUMERIC COMPLEX PO) Take by mouth. Instaflex joint care    . vitamin C (ASCORBIC ACID) 500 MG tablet Take 500 mg by mouth daily.    Marland Kitchen VITAMIN D PO Take by mouth daily. OTC    . dorzolamide (TRUSOPT) 2 % ophthalmic solution 1 drop 2 (two) times daily.  (Patient not taking: Reported on 10/14/2020)    . Vitamin D, Ergocalciferol, (DRISDOL) 1.25 MG (50000 UT) CAPS capsule Take 1 capsule by mouth once weekly. (Patient not  taking: Reported on 10/14/2020) 12 capsule 1   No current facility-administered medications on file prior to visit.    BP 112/68   Pulse 70   Temp 97.7 F (36.5 C) (Temporal)   Ht 5\' 3"  (1.6 m)   Wt 166 lb (75.3 kg)   SpO2 96%   BMI 29.41 kg/m    Objective:   Physical Exam HENT:     Right Ear: Tympanic membrane  and ear canal normal.     Left Ear: Tympanic membrane and ear canal normal.  Eyes:     Pupils: Pupils are equal, round, and reactive to light.  Cardiovascular:     Rate and Rhythm: Normal rate and regular rhythm.  Pulmonary:     Effort: Pulmonary effort is normal.     Breath sounds: Normal breath sounds.  Abdominal:     General: Bowel sounds are normal.     Palpations: Abdomen is soft.     Tenderness: There is no abdominal tenderness.  Genitourinary:    Labia:        Right: No lesion.        Left: No lesion.      Cervix: No cervical motion tenderness or discharge.     Adnexa: Right adnexa normal and left adnexa normal.  Musculoskeletal:        General: Normal range of motion.     Cervical back: Neck supple.  Skin:    General: Skin is warm and dry.  Neurological:     Mental Status: She is alert and oriented to person, place, and time.     Cranial Nerves: No cranial nerve deficit.     Deep Tendon Reflexes:     Reflex Scores:      Patellar reflexes are 2+ on the right side and 2+ on the left side. Psychiatric:        Mood and Affect: Mood normal.            Assessment & Plan:

## 2020-10-14 NOTE — Assessment & Plan Note (Signed)
Compliant to prescribed regimen, follows with ophthalmology.

## 2020-10-14 NOTE — Assessment & Plan Note (Signed)
Well controlled in the office today, continue losartan 100 mg.  BMP is UTD.

## 2020-10-16 LAB — CYTOLOGY - PAP
Comment: NEGATIVE
Diagnosis: NEGATIVE
High risk HPV: NEGATIVE

## 2021-01-02 ENCOUNTER — Other Ambulatory Visit: Payer: Self-pay | Admitting: Primary Care

## 2021-01-02 DIAGNOSIS — Z1231 Encounter for screening mammogram for malignant neoplasm of breast: Secondary | ICD-10-CM

## 2021-02-13 ENCOUNTER — Other Ambulatory Visit: Payer: Self-pay | Admitting: Primary Care

## 2021-02-13 DIAGNOSIS — I10 Essential (primary) hypertension: Secondary | ICD-10-CM

## 2021-02-20 ENCOUNTER — Ambulatory Visit: Payer: Managed Care, Other (non HMO)

## 2021-04-07 ENCOUNTER — Ambulatory Visit
Admission: RE | Admit: 2021-04-07 | Discharge: 2021-04-07 | Disposition: A | Discharge status: Home / Self Care | Payer: Managed Care, Other (non HMO) | Source: Ambulatory Visit | Attending: Primary Care | Admitting: Primary Care

## 2021-04-07 ENCOUNTER — Other Ambulatory Visit: Payer: Self-pay

## 2021-04-07 DIAGNOSIS — Z1231 Encounter for screening mammogram for malignant neoplasm of breast: Secondary | ICD-10-CM

## 2021-08-16 ENCOUNTER — Other Ambulatory Visit: Payer: Self-pay | Admitting: Primary Care

## 2021-08-16 DIAGNOSIS — I10 Essential (primary) hypertension: Secondary | ICD-10-CM

## 2021-10-16 ENCOUNTER — Encounter: Payer: Managed Care, Other (non HMO) | Admitting: Primary Care

## 2021-11-12 ENCOUNTER — Encounter: Payer: Managed Care, Other (non HMO) | Admitting: Primary Care

## 2021-11-13 ENCOUNTER — Telehealth: Payer: Self-pay | Admitting: Primary Care

## 2021-11-13 DIAGNOSIS — I10 Essential (primary) hypertension: Secondary | ICD-10-CM

## 2021-11-19 ENCOUNTER — Encounter: Payer: Self-pay | Admitting: Primary Care

## 2021-11-19 ENCOUNTER — Ambulatory Visit (INDEPENDENT_AMBULATORY_CARE_PROVIDER_SITE_OTHER): Payer: Medicare HMO | Admitting: Primary Care

## 2021-11-19 ENCOUNTER — Other Ambulatory Visit: Payer: Self-pay

## 2021-11-19 VITALS — BP 118/64 | HR 82 | Temp 97.3°F | Ht 63.0 in | Wt 164.0 lb

## 2021-11-19 DIAGNOSIS — E2839 Other primary ovarian failure: Secondary | ICD-10-CM | POA: Diagnosis not present

## 2021-11-19 DIAGNOSIS — R7303 Prediabetes: Secondary | ICD-10-CM | POA: Diagnosis not present

## 2021-11-19 DIAGNOSIS — E785 Hyperlipidemia, unspecified: Secondary | ICD-10-CM | POA: Diagnosis not present

## 2021-11-19 DIAGNOSIS — H409 Unspecified glaucoma: Secondary | ICD-10-CM

## 2021-11-19 DIAGNOSIS — Z1231 Encounter for screening mammogram for malignant neoplasm of breast: Secondary | ICD-10-CM

## 2021-11-19 DIAGNOSIS — M2550 Pain in unspecified joint: Secondary | ICD-10-CM

## 2021-11-19 DIAGNOSIS — I10 Essential (primary) hypertension: Secondary | ICD-10-CM | POA: Diagnosis not present

## 2021-11-19 DIAGNOSIS — M8580 Other specified disorders of bone density and structure, unspecified site: Secondary | ICD-10-CM

## 2021-11-19 DIAGNOSIS — Z23 Encounter for immunization: Secondary | ICD-10-CM

## 2021-11-19 DIAGNOSIS — Z0000 Encounter for general adult medical examination without abnormal findings: Secondary | ICD-10-CM | POA: Diagnosis not present

## 2021-11-19 DIAGNOSIS — M159 Polyosteoarthritis, unspecified: Secondary | ICD-10-CM

## 2021-11-19 LAB — CBC
HCT: 38.5 % (ref 36.0–46.0)
Hemoglobin: 12.6 g/dL (ref 12.0–15.0)
MCHC: 32.6 g/dL (ref 30.0–36.0)
MCV: 90 fl (ref 78.0–100.0)
Platelets: 193 10*3/uL (ref 150.0–400.0)
RBC: 4.28 Mil/uL (ref 3.87–5.11)
RDW: 14.6 % (ref 11.5–15.5)
WBC: 5.2 10*3/uL (ref 4.0–10.5)

## 2021-11-19 LAB — LIPID PANEL
Cholesterol: 211 mg/dL — ABNORMAL HIGH (ref 0–200)
HDL: 62.8 mg/dL (ref >39.00)
LDL Cholesterol: 135 mg/dL — ABNORMAL HIGH (ref 0–99)
NonHDL: 148.62
Total CHOL/HDL Ratio: 3
Triglycerides: 67 mg/dL (ref 0.0–149.0)
VLDL: 13.4 mg/dL (ref 0.0–40.0)

## 2021-11-19 LAB — COMPREHENSIVE METABOLIC PANEL
ALT: 12 U/L (ref 0–35)
AST: 16 U/L (ref 0–37)
Albumin: 4.1 g/dL (ref 3.5–5.2)
Alkaline Phosphatase: 60 U/L (ref 39–117)
BUN: 10 mg/dL (ref 6–23)
CO2: 28 mEq/L (ref 19–32)
Calcium: 9.1 mg/dL (ref 8.4–10.5)
Chloride: 108 mEq/L (ref 96–112)
Creatinine, Ser: 0.65 mg/dL (ref 0.40–1.20)
GFR: 92.53 mL/min (ref >60.00)
Glucose, Bld: 89 mg/dL (ref 70–99)
Potassium: 4.1 mEq/L (ref 3.5–5.1)
Sodium: 141 mEq/L (ref 135–145)
Total Bilirubin: 0.5 mg/dL (ref 0.2–1.2)
Total Protein: 7.1 g/dL (ref 6.0–8.3)

## 2021-11-19 LAB — SEDIMENTATION RATE: Sed Rate: 36 mm/hr — ABNORMAL HIGH (ref 0–30)

## 2021-11-19 LAB — URIC ACID: Uric Acid, Serum: 4.5 mg/dL (ref 2.4–7.0)

## 2021-11-19 LAB — HEMOGLOBIN A1C: Hgb A1c MFr Bld: 5.8 % (ref 4.6–6.5)

## 2021-11-19 LAB — C-REACTIVE PROTEIN: CRP: <1 mg/dL (ref 0.5–20.0)

## 2021-11-19 MED ORDER — ATORVASTATIN CALCIUM 10 MG PO TABS: ORAL_TABLET | ORAL | 3 refills | Status: DC

## 2021-11-19 MED ORDER — LOSARTAN POTASSIUM 100 MG PO TABS: ORAL_TABLET | ORAL | 3 refills | Status: DC

## 2021-11-19 NOTE — Addendum Note (Signed)
Addended by: Francella Solian on: 11/19/2021 09:44 AM   Modules accepted: Orders

## 2021-11-19 NOTE — Assessment & Plan Note (Signed)
Discussed the importance of a healthy diet and regular exercise in order for weight loss, and to reduce the risk of further co-morbidity. ? ?Repeat A1C pending. ?

## 2021-11-19 NOTE — Progress Notes (Signed)
HPI: Madison [name] presents today for Welcome to [facility] Wellness Visit and for follow up of chronic medical conditions.   She would also like to discuss increased chronic joint pain to bilateral elbow joints, knees, fingers, right achiles pain. She denies swelling and redness. She's questioning if she has rheumatoid arthritis, denies family history.   Past Medical History:  Diagnosis Date   Arthritis    Borderline diabetes    Chicken pox    Glaucoma    Heart murmur    Hypertension    Osteopenia    Seasonal allergies     Current Outpatient Medications  Medication Sig Dispense Refill   atorvastatin (LIPITOR) 10 MG tablet TAKE 1 TABLET BY MOUTH EVERY DAY IN THE EVENING FOR CHOLESTEROL 90 tablet 3   CALCIUM PO Take by mouth.     levocetirizine (XYZAL) 5 MG tablet Take 5 mg by mouth every evening.     losartan (COZAAR) 100 MG tablet TAKE 1 TABLET BY MOUTH DAILY. FOR BLOOD PRESSURE. OFFICE VISIT REQUIRED FOR FURTHER REFILLS. 30 tablet 0   LUMIGAN 0.01 % SOLN INSTILL ONE DROP INTO BOTH EYES AT BEDTIME  6   timolol (TIMOPTIC) 0.25 % ophthalmic solution USE 1 DROP(S) IN BOTH EYES ONCE IN THE MORNING FOR 30 DAYS  6   Turmeric (QC TUMERIC COMPLEX PO) Take by mouth. Instaflex joint care     vitamin C (ASCORBIC ACID) 500 MG tablet Take 500 mg by mouth daily.     VITAMIN D PO Take by mouth daily. OTC     No current facility-administered medications for this visit.    No Known Allergies  Family History  Problem Relation Age of Onset   Arthritis Mother    Hyperlipidemia Mother    Heart disease Mother    Stroke Mother    Hypertension Mother    Diabetes Father    Diabetes Brother    Arthritis Maternal Grandmother    Diabetes Paternal Grandmother    Esophageal cancer Neg Hx    Colon cancer Neg Hx    Colon polyps Neg Hx    Rectal cancer Neg Hx    Stomach cancer Neg Hx     Social History   Socioeconomic History   Marital status: Married    Spouse name: Not on file   Number of  children: Not on file   Years of education: Not on file   Highest education level: Not on file  Occupational History   Not on file  Tobacco Use   Smoking status: Never   Smokeless tobacco: Never  Vaping Use   Vaping Use: Never used  Substance and Sexual Activity   Alcohol use: No    Alcohol/week: 0.0 standard drinks   Drug use: No   Sexual activity: Not on file  Other Topics Concern   Not on file  Social History Narrative   Married.   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   Social Determinants of Health   Financial Resource Strain: Not on file  Food Insecurity: Not on file  Transportation Needs: Not on file  Physical Activity: Not on file  Stress: Not on file  Social Connections: Not on file  Intimate Partner Violence: Not on file    Hospitiliaztions: None  Health Maintenance:    Vaccines:  Influenza: Completed this season   Tetanus: 2017  Pneumovax: 2006  Prevnar: Due  Zostavax/Shingrix: Shingrix completed   Imaging/Procedures:   Bone Density:  2018  Colonoscopy: 2020, due 2027  Mammogram: May 2022   Other:   Eye Doctor: Completes semi-annually  Dental Exam: Completes semi-annually     Providers: Alma Friendly, PCP, eye doctor, dentist, oral surgeon    I have personally reviewed and have noted: 1. The patient's medical and social history 2. Their use of alcohol, tobacco or illicit drugs 3. Their current medications and supplements 4. The patient's functional ability including ADL's, fall risks, home safety risks and   hearing or visual impairment. 5. Diet and physical activities 6. Evidence for depression or mood disorder  Subjective:   Review of Systems:   Constitutional: Denies fever, malaise, fatigue, headache or abrupt weight changes.  HEENT: Denies eye pain, eye redness, ear pain, ringing in the ears, runny nose, nasal congestion, bloody nose, or sore throat. Respiratory: Denies  difficulty breathing, shortness of breath, cough with/without sputum production.   Cardiovascular: Denies chest pain, chest tightness, palpitations, or swelling in the hands or feet.  Gastrointestinal: Denies abdominal pain, bloating, constipation, or blood in the stool. She does notice GERD.  GU: Denies urgency, frequency, dysuria, hematuria, odor or discharge. Musculoskeletal: Denies decrease in range of motion, difficulty with gait. Chronic arthritis to joints in elbows, fingers, knees. Denies swelling.  Skin: Denies redness, rashes, lesions, or ulcercations.  Neurological: Denies dizziness, difficulty with memory, difficulty with speech, or problems with balance and coordination.  Psychiatric: Denies concerns for anxiety or depression.   No other specific complaints in a complete review of systems (except as listed in HPI above).  Objective:  PE:   BP 118/64    Pulse 82    Temp (!) 97.3 F (36.3 C) (Temporal)    Ht 5\' 3"  (1.6 m)    Wt 164 lb (74.4 kg)    SpO2 96%    BMI 29.05 kg/m  Wt Readings from Last 3 Encounters:  11/19/21 164 lb (74.4 kg)  10/14/20 166 lb (75.3 kg)  08/07/20 168 lb (76.2 kg)    General: Appears their stated age, well developed, well nourished in NAD. Skin: Warm, dry and intact. No rashes, lesions or ulcerations noted. HEENT: Head: normal shape and size; Eyes: sclera white, no icterus, conjunctiva pink, PERRLA and EOMs intact; Ears: Tm's gray and intact, normal light reflex; Nose: mucosa pink and moist, septum midline; Throat/Mouth: Teeth present, mucosa pink and moist, no exudate, lesions or ulcerations noted.  Neck: Normal range of motion. Neck supple, trachea midline. No massses, lumps or thyromegaly present.  Cardiovascular: Normal rate and rhythm. S1,S2 noted.  No murmur, rubs or gallops noted. No JVD or BLE edema. No carotid bruits noted. Pulmonary/Chest: Normal effort and positive vesicular breath sounds. No respiratory distress. No wheezes, rales or  ronchi noted.  Abdomen: Soft and nontender. Normal bowel sounds, no bruits noted. No distention or masses noted. Liver, spleen and kidneys non palpable. Musculoskeletal: Normal range of motion. No signs of joint swelling. No difficulty with gait.  Neurological: Alert and oriented. Cranial nerves II-XII intact. Coordination normal. +DTRs bilaterally. Psychiatric: Mood and affect normal. Behavior is normal. Judgment and thought content normal.   EKG: NSR with rate of 60. No PAC/PVC, ST changes. Appears similar compared to ECG from 2019  BMET    Component Value Date/Time   NA 142 07/10/2020 0952   NA 142 09/07/2017 0834   K 4.2 07/10/2020 0952   CL 109 07/10/2020 0952   CO2 27 07/10/2020 0952   GLUCOSE 98 07/10/2020 0952   BUN 10 07/10/2020 2130  BUN 9 09/07/2017 0834   CREATININE 0.65 07/10/2020 0952   CALCIUM 9.2 07/10/2020 0952   GFRNONAA 96 09/07/2017 0834   GFRAA 111 09/07/2017 0834    Lipid Panel     Component Value Date/Time   CHOL 146 10/14/2020 1111   CHOL 212 (H) 09/07/2017 0834   TRIG 55.0 10/14/2020 1111   HDL 60.20 10/14/2020 1111   HDL 61 09/07/2017 0834   CHOLHDL 2 10/14/2020 1111   VLDL 11.0 10/14/2020 1111   LDLCALC 75 10/14/2020 1111   LDLCALC 134 (H) 09/07/2017 0834    CBC    Component Value Date/Time   WBC 5.4 07/10/2020 0952   RBC 4.32 07/10/2020 0952   HGB 12.7 07/10/2020 0952   HGB 12.8 09/07/2017 0834   HCT 39.0 07/10/2020 0952   HCT 40.6 09/07/2017 0834   PLT 183.0 07/10/2020 0952   PLT 205 09/07/2017 0834   MCV 90.3 07/10/2020 0952   MCV 93 09/07/2017 0834   MCH 29.4 09/07/2017 0834   MCHC 32.7 07/10/2020 0952   RDW 15.0 07/10/2020 0952   RDW 15.1 09/07/2017 0834   LYMPHSABS 1.3 09/07/2017 0834   MONOABS 0.3 11/01/2008 0932   EOSABS 0.1 09/07/2017 0834   BASOSABS 0.0 09/07/2017 0834    Hgb A1C Lab Results  Component Value Date   HGBA1C 5.7 07/10/2020      Assessment and Plan:   Medicare Annual Wellness Visit:  Diet:  Fair diet. Physical activity: Sedentary Depression/mood screen: Negative Hearing: Intact to whispered voice Visual acuity: Grossly normal, performs annual eye exam  ADLs: Capable Fall risk: None Home safety: Good Cognitive evaluation: Intact to orientation, naming, recall and repetition EOL planning: Adv directives, full code/ I agree  Preventative Medicine: Prevnar 20 due and provided today. Other vaccines UTD. Colonoscopy UTD, due 2027. Mammogram and bone density scan due this year, ordered.   Discussed the importance of a healthy diet and regular exercise in order for weight loss, and to reduce the risk of further co-morbidity.  Exam today stable. Labs pending.  All recommendations provided at end of visit.  Next appointment: 1 year or sooner if needed

## 2021-11-19 NOTE — Assessment & Plan Note (Signed)
Suspect osteoarthritis contributing to her joint pain, but will rule out RA and gout.   Labs pending.

## 2021-11-19 NOTE — Assessment & Plan Note (Signed)
Out of atorvastatin 10 mg for months, refills provided today. Lipid panel pending.

## 2021-11-19 NOTE — Assessment & Plan Note (Signed)
Following with ophthalmology, continue drops

## 2021-11-19 NOTE — Assessment & Plan Note (Signed)
Well controlled in the office today, continue losartan 100 mg.   CMP pending.

## 2021-11-19 NOTE — Assessment & Plan Note (Signed)
Repeat bone density scan pending.  Compliant to calcium and vitamin D. Discussed weight bearing exercise.

## 2021-11-19 NOTE — Patient Instructions (Signed)
Stop by the lab prior to leaving today. I will notify you of your results once received.   [facility] to schedule your mammogram and bone density test.   It was a pleasure to see you today!

## 2021-11-19 NOTE — Assessment & Plan Note (Signed)
Prevnar 20 due, provided today. Other vaccines UTD. Colonoscopy UTD, due 2027. Mammogram and bone density scan due this year, ordered.   Discussed the importance of a healthy diet and regular exercise in order for weight loss, and to reduce the risk of further co-morbidity.  Exam today stable.  I have personally reviewed and have noted: 1. The patient's medical and social history 2. Their use of alcohol, tobacco or illicit drugs 3. Their current medications and supplements 4. The patient's functional ability including ADL's, fall risks, home safety risks and   hearing or visual impairment. 5. Diet and physical activities 6. Evidence for depression or mood disorder

## 2021-11-24 LAB — CYCLIC CITRUL PEPTIDE ANTIBODY, IGG: Cyclic Citrullin Peptide Ab: <16 UNITS

## 2021-11-24 LAB — ANTI-NUCLEAR AB-TITER (ANA TITER): ANA Titer 1: 1:40 {titer} — ABNORMAL HIGH

## 2021-11-24 LAB — RHEUMATOID FACTOR: Rheumatoid fact SerPl-aCnc: <14 IU/mL (ref <14)

## 2021-11-24 LAB — ANA: Anti Nuclear Antibody (ANA): POSITIVE — AB

## 2021-11-25 NOTE — Telephone Encounter (Signed)
Returning [place] phone call

## 2021-11-27 NOTE — Telephone Encounter (Signed)
Call was about lab results. Please see lab note for documentation.

## 2022-02-03 ENCOUNTER — Other Ambulatory Visit: Payer: Self-pay | Admitting: Primary Care

## 2022-02-03 DIAGNOSIS — E785 Hyperlipidemia, unspecified: Secondary | ICD-10-CM

## 2022-02-17 ENCOUNTER — Other Ambulatory Visit (INDEPENDENT_AMBULATORY_CARE_PROVIDER_SITE_OTHER): Payer: Medicare HMO

## 2022-02-17 DIAGNOSIS — E785 Hyperlipidemia, unspecified: Secondary | ICD-10-CM

## 2022-02-17 LAB — LIPID PANEL
Cholesterol: 143 mg/dL (ref 0–200)
HDL: 59.8 mg/dL (ref >39.00)
LDL Cholesterol: 73 mg/dL (ref 0–99)
NonHDL: 83.21
Total CHOL/HDL Ratio: 2
Triglycerides: 52 mg/dL (ref 0.0–149.0)
VLDL: 10.4 mg/dL (ref 0.0–40.0)

## 2022-03-21 IMAGING — MG MM DIGITAL SCREENING BILAT W/ TOMO AND CAD
8 series · 8 of 24 positions shown · non-contrast
Comparison: Previous exam(s).

CLINICAL DATA: Screening.

EXAM:
DIGITAL SCREENING BILATERAL MAMMOGRAM WITH TOMOSYNTHESIS AND CAD
TECHNIQUE: Bilateral screening digital craniocaudal and mediolateral oblique
mammograms were obtained. Bilateral screening digital breast
tomosynthesis was performed. The images were evaluated with
computer-aided detection.

[R MLO synth-2D]
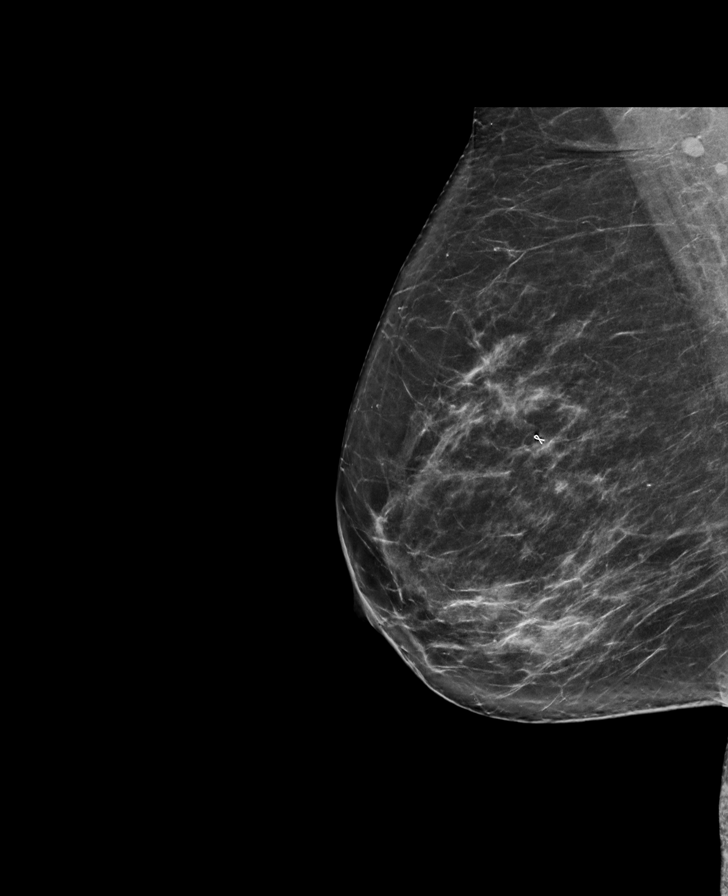

[L CC synth-2D]
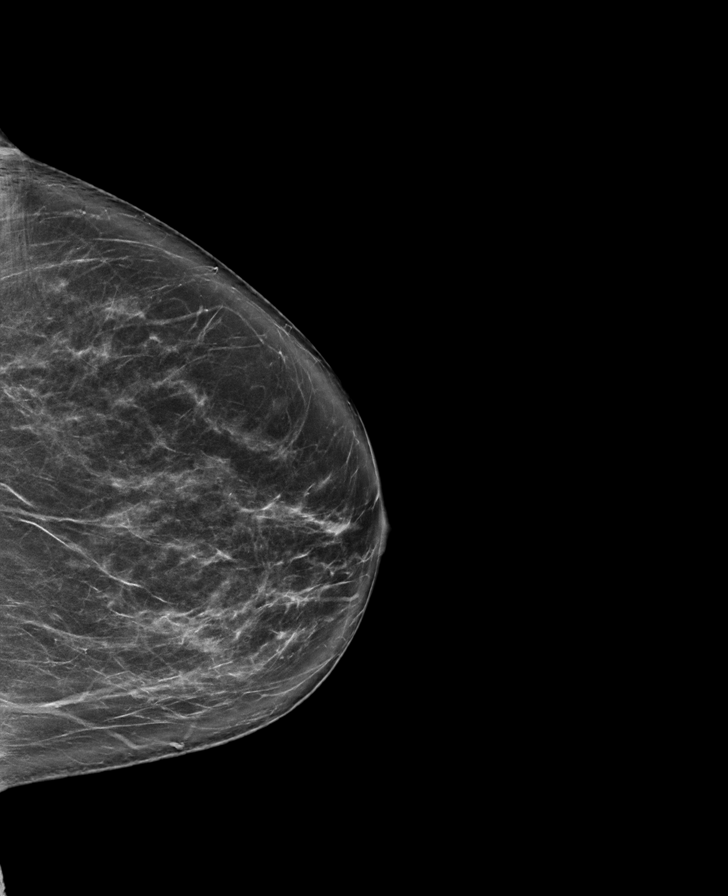

[L MLO synth-2D]
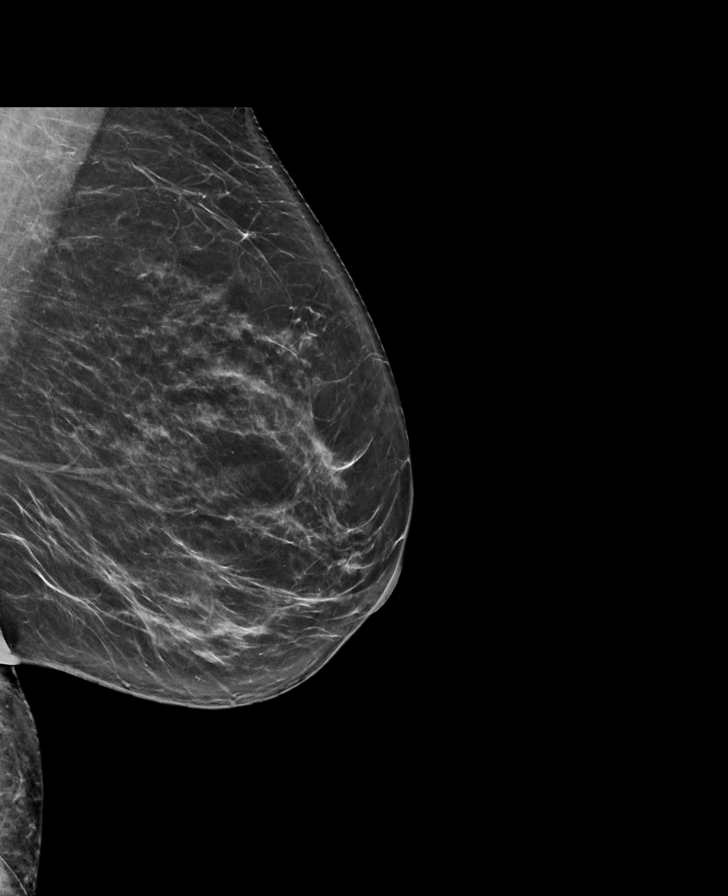

[R CC synth-2D]
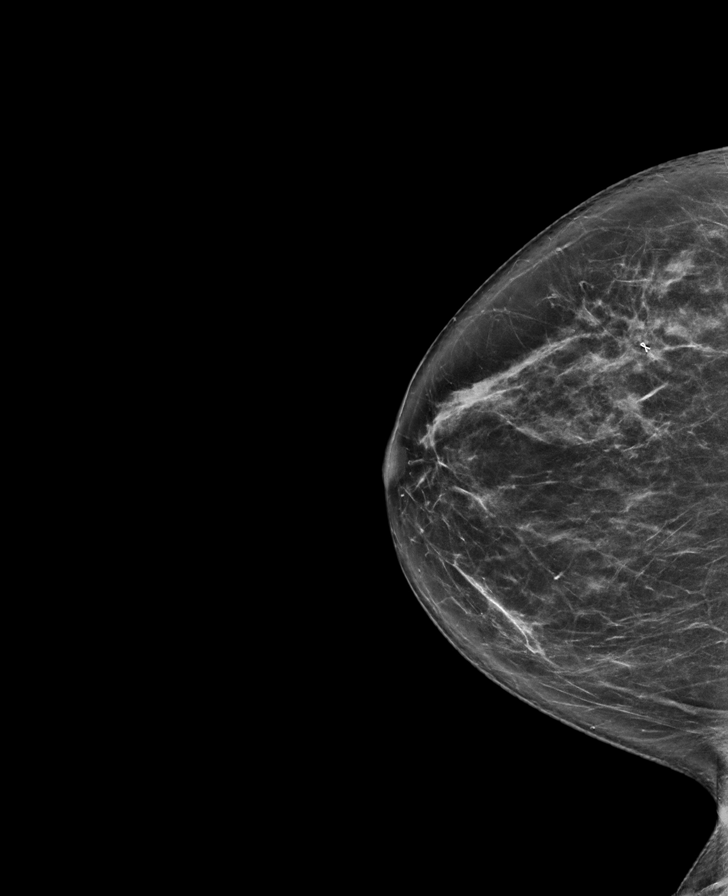

[R MLO tomo · tomo slice 39/78.0]
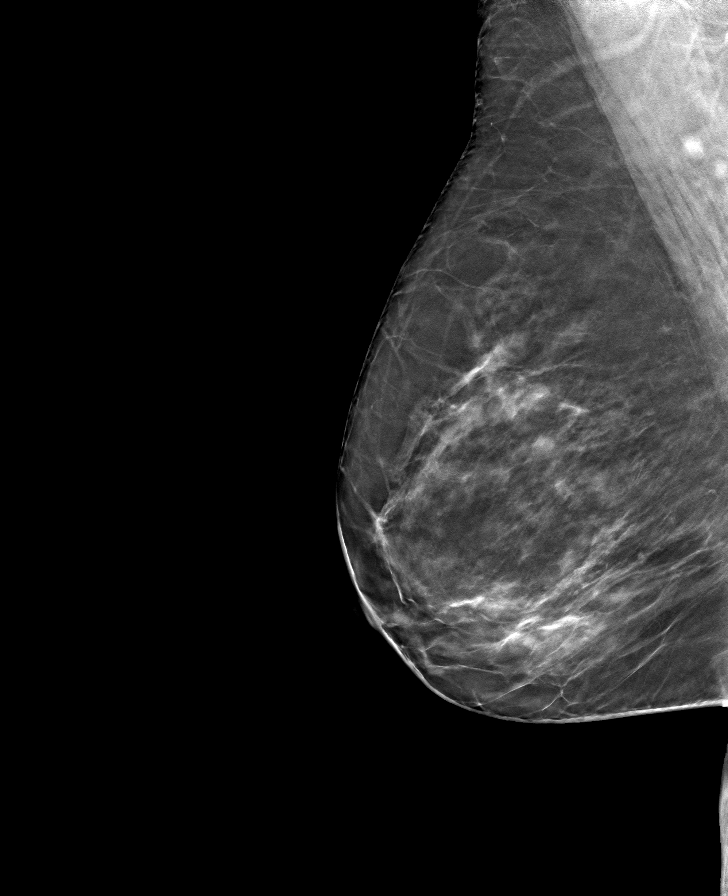

[L MLO tomo · tomo slice 41/80.0]
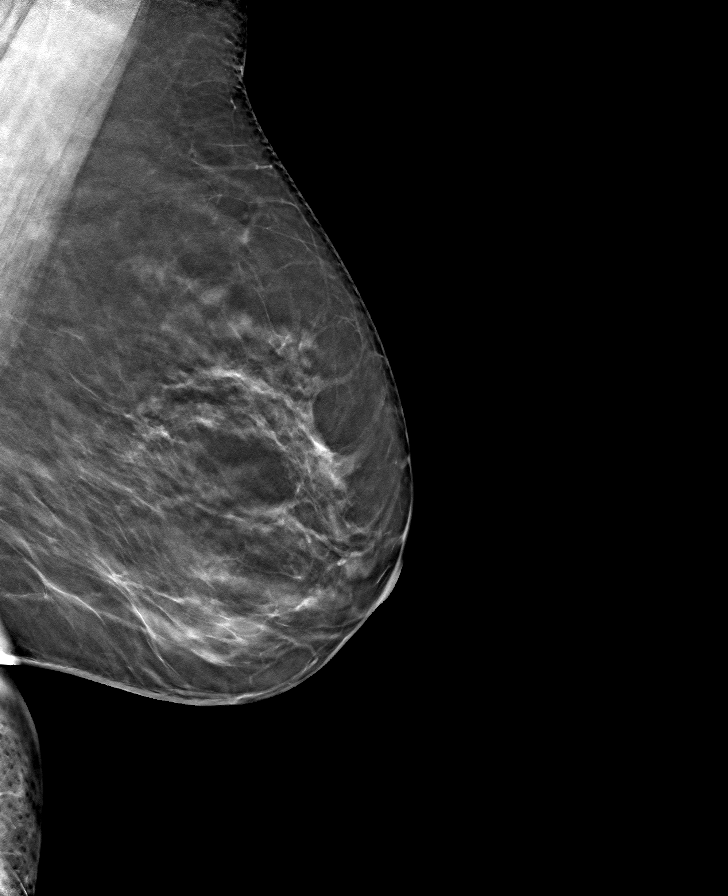

[L CC tomo · tomo slice 41/80.0]
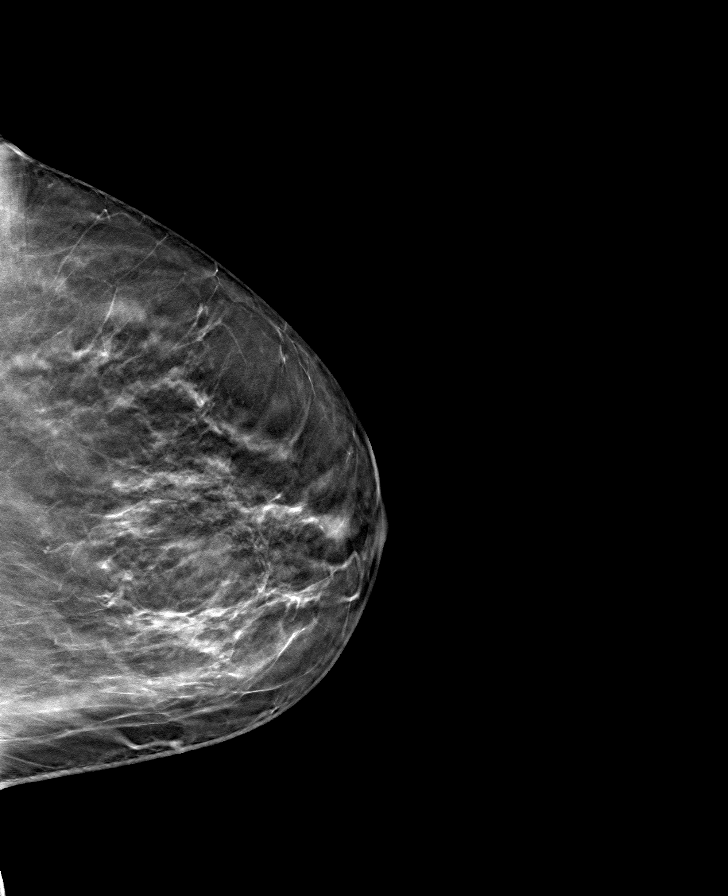

[R CC tomo · tomo slice 38/75.0]
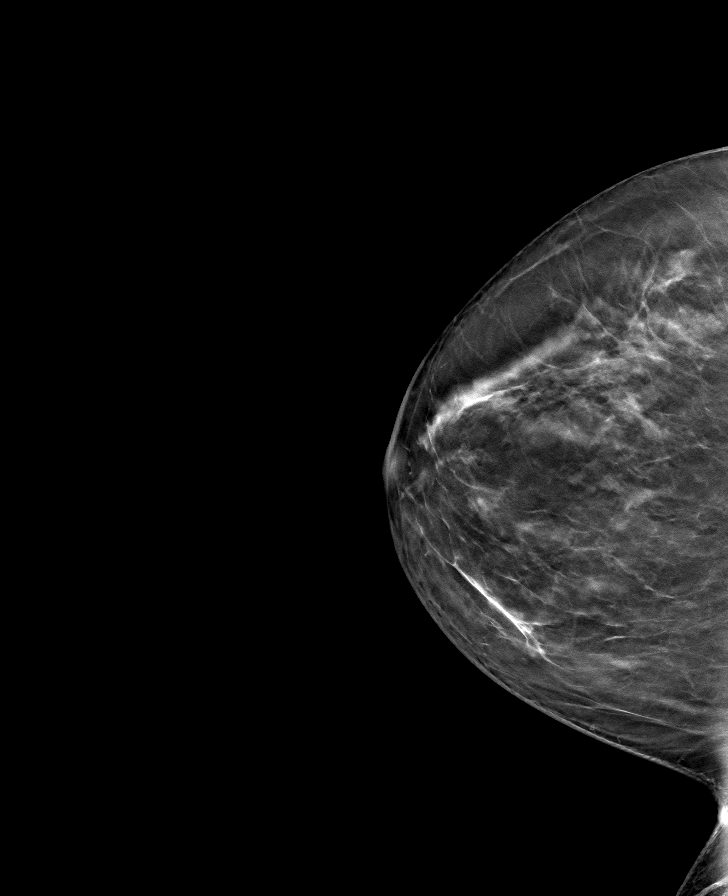

[8 of 24 positions shown; findings below may reference images not displayed]

ACR Breast Density Category b: There are scattered areas of
fibroglandular density.
FINDINGS: There are no findings suspicious for malignancy. The images were
evaluated with computer-aided detection.
IMPRESSION: No mammographic evidence of malignancy. A result letter of this
screening mammogram will be mailed directly to the patient.

RECOMMENDATION:
Screening mammogram in one year. (Code:[id number])

BI-RADS CATEGORY  1: Negative.

## 2022-04-16 ENCOUNTER — Encounter: Payer: Self-pay | Admitting: Primary Care

## 2022-04-16 ENCOUNTER — Ambulatory Visit (INDEPENDENT_AMBULATORY_CARE_PROVIDER_SITE_OTHER): Payer: Medicare HMO | Admitting: Primary Care

## 2022-04-16 ENCOUNTER — Ambulatory Visit (INDEPENDENT_AMBULATORY_CARE_PROVIDER_SITE_OTHER)
Admission: RE | Admit: 2022-04-16 | Discharge: 2022-04-16 | Disposition: A | Discharge status: Home / Self Care | Payer: Medicare HMO | Source: Ambulatory Visit | Attending: Primary Care | Admitting: Primary Care

## 2022-04-16 VITALS — BP 120/68 | HR 80 | Temp 98.6°F | Ht 63.0 in | Wt 165.0 lb

## 2022-04-16 DIAGNOSIS — R051 Acute cough: Secondary | ICD-10-CM | POA: Insufficient documentation

## 2022-04-16 MED ORDER — BENZONATATE 200 MG PO CAPS: 200.0000 mg | ORAL_CAPSULE | Freq: Three times a day (TID) | ORAL | 0 refills | Status: DC | PRN

## 2022-04-16 MED ORDER — AZITHROMYCIN 250 MG PO TABS: ORAL_TABLET | ORAL | 0 refills | Status: DC

## 2022-04-16 NOTE — Assessment & Plan Note (Signed)
Suspicious for bacterial cause, especially given presentation and duration of symptoms.   Chest xray pending today.  Start Azithromycin course x 5 days.  Start Tessalon Perles 200 mg TID PRN.  Discussed other conservative treatment.  She is stable for outpatient treatment.  Return precautions provided.

## 2022-04-16 NOTE — Progress Notes (Signed)
Subjective:    Patient ID: Madison [name], female    DOB: [birth year], 66 y.o.   MRN: [id number]  HPI  Madison [name] is a very pleasant 66 y.o. female with a history of hypertension, glaucoma, prediabetes, hyperlipidemia who presents today to discuss cough.  Symptom onset about 1 month ago with a deep cough. She then progressed to rhinorrhea, fatigue, headaches, chills. Some chest tightness and shortness of breath. Symptoms has wax and waned over the last month. This week she began feeling worse.   She's been taking Zyrtec 10 mg daily, Mucinex, Benadryl, Delsym with minimal improvement.   She has never smoked. She denies a history of asthma. She tested negative for Covid-19 infection yesterday.    BP Readings from Last 3 Encounters:  04/16/22 120/68  11/19/21 118/64  10/14/20 112/68       Review of Systems  Constitutional:  Positive for chills and fatigue. Negative for fever.  HENT:  Positive for congestion and rhinorrhea.   Respiratory:  Positive for cough, chest tightness and shortness of breath.   Cardiovascular:  Negative for chest pain.        Past Medical History:  Diagnosis Date   Arthritis    Borderline diabetes    Chicken pox    Glaucoma    Heart murmur    Hypertension    Osteopenia    Seasonal allergies     Social History   Socioeconomic History   Marital status: Married    Spouse name: Not on file   Number of children: Not on file   Years of education: Not on file   Highest education level: Not on file  Occupational History   Not on file  Tobacco Use   Smoking status: Never   Smokeless tobacco: Never  Vaping Use   Vaping Use: Never used  Substance and Sexual Activity   Alcohol use: No    Alcohol/week: 0.0 standard drinks   Drug use: No   Sexual activity: Not on file  Other Topics Concern   Not on file  Social History Narrative   Married.   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    Social Determinants of Health   Financial Resource Strain: Not on file  Food Insecurity: Not on file  Transportation Needs: Not on file  Physical Activity: Not on file  Stress: Not on file  Social Connections: Not on file  Intimate Partner Violence: Not on file    Past Surgical History:  Procedure Laterality Date   BREAST BIOPSY Right 11/24/2017   rt breast stereo, benign   COLONOSCOPY  12/02/2006   TONSILLECTOMY AND ADENOIDECTOMY  1961    Family History  Problem Relation Age of Onset   Arthritis Mother    Hyperlipidemia Mother    Heart disease Mother    Stroke Mother    Hypertension Mother    Diabetes Father    Diabetes Brother    Arthritis Maternal Grandmother    Diabetes Paternal Grandmother    Esophageal cancer Neg Hx    Colon cancer Neg Hx    Colon polyps Neg Hx    Rectal cancer Neg Hx    Stomach cancer Neg Hx     No Known Allergies  Current Outpatient Medications on File Prior to Visit  Medication Sig Dispense Refill   atorvastatin (LIPITOR) 10 MG tablet TAKE 1 TABLET BY MOUTH EVERY DAY IN THE EVENING FOR CHOLESTEROL 90 tablet 3  CALCIUM PO Take by mouth.     cetirizine (ZYRTEC) 10 MG tablet Take 10 mg by mouth daily.     losartan (COZAAR) 100 MG tablet TAKE 1 TABLET BY MOUTH DAILY. FOR BLOOD PRESSURE. 90 tablet 3   LUMIGAN 0.01 % SOLN INSTILL ONE DROP INTO BOTH EYES AT BEDTIME  6   timolol (TIMOPTIC) 0.25 % ophthalmic solution USE 1 DROP(S) IN BOTH EYES ONCE IN THE MORNING FOR 30 DAYS  6   Turmeric (QC TUMERIC COMPLEX PO) Take by mouth. Instaflex joint care     vitamin C (ASCORBIC ACID) 500 MG tablet Take 500 mg by mouth daily.     VITAMIN D PO Take by mouth daily. OTC     No current facility-administered medications on file prior to visit.    BP 120/68   Pulse 80   Temp 98.6 F (37 C) (Oral)   Ht '5\' 3"'$  (1.6 m)   Wt 165 lb (74.8 kg)   SpO2 97%   BMI 29.23 kg/m  Objective:   Physical Exam Constitutional:      Appearance: She is  ill-appearing.  HENT:     Right Ear: Tympanic membrane and ear canal normal.     Left Ear: Tympanic membrane and ear canal normal.     Nose:     Right Sinus: No maxillary sinus tenderness or frontal sinus tenderness.     Left Sinus: No maxillary sinus tenderness or frontal sinus tenderness.     Mouth/Throat:     Pharynx: No posterior oropharyngeal erythema.  Eyes:     Conjunctiva/sclera: Conjunctivae normal.  Cardiovascular:     Rate and Rhythm: Normal rate and regular rhythm.  Pulmonary:     Effort: Pulmonary effort is normal.     Breath sounds: Examination of the left-lower field reveals decreased breath sounds and rhonchi. Decreased breath sounds and rhonchi present. No wheezing or rales.     Comments: Congested cough noted a few times during visit  Musculoskeletal:     Cervical back: Neck supple.  Lymphadenopathy:     Cervical: Cervical adenopathy present.  Skin:    General: Skin is warm and dry.          Assessment & Plan:

## 2022-04-16 NOTE — Patient Instructions (Addendum)
Start Azithromycin antibiotics for infection. Take 2 tablets by mouth today, then 1 tablet daily for 4 additional days.  You may take Benzonatate capsules for cough. Take 1 capsule by mouth three times daily as needed for cough.  Complete xray(s) prior to leaving today. I will notify you of your results once received.  It was a pleasure to see you today!

## 2022-06-01 ENCOUNTER — Ambulatory Visit
Admission: RE | Admit: 2022-06-01 | Discharge: 2022-06-01 | Disposition: A | Discharge status: Home / Self Care | Payer: Medicare HMO | Source: Ambulatory Visit | Attending: Primary Care | Admitting: Primary Care

## 2022-06-01 DIAGNOSIS — E2839 Other primary ovarian failure: Secondary | ICD-10-CM

## 2022-06-01 DIAGNOSIS — Z1231 Encounter for screening mammogram for malignant neoplasm of breast: Secondary | ICD-10-CM

## 2022-06-01 DIAGNOSIS — M8580 Other specified disorders of bone density and structure, unspecified site: Secondary | ICD-10-CM

## 2022-10-18 ENCOUNTER — Other Ambulatory Visit: Payer: Self-pay | Admitting: Primary Care

## 2022-10-18 DIAGNOSIS — E785 Hyperlipidemia, unspecified: Secondary | ICD-10-CM

## 2022-10-23 ENCOUNTER — Ambulatory Visit
Admission: EM | Admit: 2022-10-23 | Discharge: 2022-10-23 | Disposition: A | Discharge status: Home / Self Care | Payer: Medicare HMO | Attending: Urgent Care | Admitting: Urgent Care

## 2022-10-23 DIAGNOSIS — J209 Acute bronchitis, unspecified: Secondary | ICD-10-CM

## 2022-10-23 DIAGNOSIS — J019 Acute sinusitis, unspecified: Secondary | ICD-10-CM

## 2022-10-23 DIAGNOSIS — B9689 Other specified bacterial agents as the cause of diseases classified elsewhere: Secondary | ICD-10-CM

## 2022-10-23 MED ORDER — AZITHROMYCIN 250 MG PO TABS: ORAL_TABLET | ORAL | 0 refills | Status: DC

## 2022-10-23 MED ORDER — BENZONATATE 100 MG PO CAPS: ORAL_CAPSULE | ORAL | 0 refills | Status: DC

## 2022-10-23 NOTE — ED Triage Notes (Signed)
Pt. Presents to UC w/ c/o a cough and congestion for the past 3-4 weeks. Pt. States this is a recurrent issue for her.

## 2022-10-23 NOTE — ED Provider Notes (Signed)
[facility]    CSN: [id number] Arrival date & time: 10/23/22  5400      History   Chief Complaint Chief Complaint  Patient presents with   Cough   Nasal Congestion    HPI Madison [name] is a 66 y.o. female.    Cough   Presents to urgent care with complaint of cough and congestion x 3 to 4 weeks. She states she has "the crud" that "everyone is getting". Denies fever, chills, myalgias. Endorses sinus pressure, though not pain.  Patient endorses recurrent cough.  Chart shows office visit on 04/16/2022 with her primary care provider where she complained of acute cough.  She was treated with azithromycin and Tessalon Perles at that visit.  Chest x-ray was negative for acute cardiopulmonary disease.    Past Medical History:  Diagnosis Date   Arthritis    Borderline diabetes    Chicken pox    Glaucoma    Heart murmur    Hypertension    Osteopenia    Seasonal allergies     Patient Active Problem List   Diagnosis Date Noted   Acute cough 04/16/2022   Welcome to Medicare preventive visit 11/19/2021   Vitamin D deficiency 07/10/2020   Vitamin B12 deficiency 07/10/2020   Fatigue 07/10/2020   Prediabetes 05/22/2019   Osteoarthritis 05/22/2019   Preventative health care 05/25/2016   Hyperlipidemia 05/25/2016   Osteopenia 05/25/2016   Knee pain, bilateral 08/20/2015   Essential hypertension 08/20/2015   Glaucoma 08/20/2015    Past Surgical History:  Procedure Laterality Date   BREAST BIOPSY Right 11/24/2017   rt breast stereo, benign   COLONOSCOPY  12/02/2006   TONSILLECTOMY AND ADENOIDECTOMY  1961    OB History   No obstetric history on file.      Home Medications    Prior to Admission medications   Medication Sig Start Date End Date Taking? Authorizing Provider  atorvastatin (LIPITOR) 10 MG tablet TAKE 1 TABLET BY MOUTH EVERY DAY IN THE EVENING FOR CHOLESTEROL 11/19/21   Pleas Koch, NP  azithromycin (ZITHROMAX) 250 MG tablet Take 2 tablets  by mouth today, then 1 tablet daily for 4 additional days. 04/16/22   Pleas Koch, NP  benzonatate (TESSALON) 200 MG capsule Take 1 capsule (200 mg total) by mouth 3 (three) times daily as needed for cough. 04/16/22   Pleas Koch, NP  CALCIUM PO Take by mouth.    [provider]  cetirizine (ZYRTEC) 10 MG tablet Take 10 mg by mouth daily.    [provider]  losartan (COZAAR) 100 MG tablet TAKE 1 TABLET BY MOUTH DAILY. FOR BLOOD PRESSURE. 11/19/21   Pleas Koch, NP  LUMIGAN 0.01 % SOLN INSTILL ONE DROP INTO BOTH EYES AT BEDTIME 08/13/15   [provider]  timolol (TIMOPTIC) 0.25 % ophthalmic solution USE 1 DROP(S) IN BOTH EYES ONCE IN THE MORNING FOR 30 DAYS 08/13/15   [provider]  Turmeric (QC TUMERIC COMPLEX PO) Take by mouth. Instaflex joint care    [provider]  vitamin C (ASCORBIC ACID) 500 MG tablet Take 500 mg by mouth daily.    [provider]  VITAMIN D PO Take by mouth daily. OTC    [provider]    Family History Family History  Problem Relation Age of Onset   Arthritis Mother    Hyperlipidemia Mother    Heart disease Mother    Stroke Mother    Hypertension Mother  Diabetes Father    Diabetes Brother    Arthritis Maternal Grandmother    Diabetes Paternal Grandmother    Esophageal cancer Neg Hx    Colon cancer Neg Hx    Colon polyps Neg Hx    Rectal cancer Neg Hx    Stomach cancer Neg Hx     Social History Social History   Tobacco Use   Smoking status: Never   Smokeless tobacco: Never  Vaping Use   Vaping Use: Never used  Substance Use Topics   Alcohol use: No    Alcohol/week: 0.0 standard drinks of alcohol   Drug use: No     Allergies   Patient has no known allergies.   Review of Systems Review of Systems  Respiratory:  Positive for cough.      Physical Exam Triage Vital Signs ED Triage Vitals [10/23/22 0845]  Enc Vitals Group     BP (!) 148/81     Pulse Rate  75     Resp 16     Temp 98.7 F (37.1 C)     Temp src      SpO2 97 %     Weight      Height      Head Circumference      Peak Flow      Pain Score 0     Pain Loc      Pain Edu?      Excl. in [place]?    No data found.  Updated Vital Signs BP (!) 148/81   Pulse 75   Temp 98.7 F (37.1 C)   Resp 16   SpO2 97%   Visual Acuity Right Eye Distance:   Left Eye Distance:   Bilateral Distance:    Right Eye Near:   Left Eye Near:    Bilateral Near:     Physical Exam Vitals reviewed.  Constitutional:      Appearance: Normal appearance.  Cardiovascular:     Rate and Rhythm: Normal rate and regular rhythm.     Pulses: Normal pulses.     Heart sounds: Normal heart sounds.  Pulmonary:     Effort: Pulmonary effort is normal.     Breath sounds: Normal breath sounds.  Skin:    General: Skin is warm and dry.  Neurological:     General: No focal deficit present.     Mental Status: She is alert and oriented to person, place, and time.  Psychiatric:        Mood and Affect: Mood normal.        Behavior: Behavior normal.      UC Treatments / Results  Labs (all labs ordered are listed, but only abnormal results are displayed) Labs Reviewed - No data to display  EKG   Radiology No results found.  Procedures Procedures (including critical care time)  Medications Ordered in UC Medications - No data to display  Initial Impression / Assessment and Plan / UC Course  I have reviewed the triage vital signs and the nursing notes.  Pertinent labs & imaging results that were available during my care of the patient were reviewed by me and considered in my medical decision making (see chart for details).   Patient is afebrile here without recent antipyretics. Satting well on room air. Overall is ill appearing, well hydrated, without respiratory distress. Pulmonary exam is unremarkable. Lung CTAB with bronchial BS. Cough is harsh and wet sounding.  Given duration of symptoms will  treat for presumed  secondary bacterial infection, possibly sinus.  Patient is requesting azithromycin though I would prefer to prescribe Augmentin due to reduced resistance and better coverage of upper respiratory strep.  Will also give benzonatate which she states has been effective for her in the past.  If cough does not resolve, would suggest a course of prednisone as the neck step.  Final Clinical Impressions(s) / UC Diagnoses   Final diagnoses:  None   Discharge Instructions   None    ED Prescriptions   None    PDMP not reviewed this encounter.   Rose Phi, [place] 10/23/22 [phone]

## 2022-10-23 NOTE — Discharge Instructions (Signed)
Follow up here or with your primary care provider if your symptoms are worsening or not improving with treatment.     

## 2022-11-23 ENCOUNTER — Ambulatory Visit (INDEPENDENT_AMBULATORY_CARE_PROVIDER_SITE_OTHER): Payer: Medicare HMO | Admitting: Primary Care

## 2022-11-23 ENCOUNTER — Encounter: Payer: Self-pay | Admitting: Primary Care

## 2022-11-23 VITALS — BP 138/84 | HR 70 | Temp 97.9°F | Ht 63.0 in | Wt 164.0 lb

## 2022-11-23 DIAGNOSIS — R7303 Prediabetes: Secondary | ICD-10-CM

## 2022-11-23 DIAGNOSIS — I10 Essential (primary) hypertension: Secondary | ICD-10-CM | POA: Diagnosis not present

## 2022-11-23 DIAGNOSIS — M159 Polyosteoarthritis, unspecified: Secondary | ICD-10-CM | POA: Diagnosis not present

## 2022-11-23 DIAGNOSIS — Z0000 Encounter for general adult medical examination without abnormal findings: Secondary | ICD-10-CM | POA: Diagnosis not present

## 2022-11-23 DIAGNOSIS — H409 Unspecified glaucoma: Secondary | ICD-10-CM

## 2022-11-23 DIAGNOSIS — E785 Hyperlipidemia, unspecified: Secondary | ICD-10-CM

## 2022-11-23 DIAGNOSIS — M8580 Other specified disorders of bone density and structure, unspecified site: Secondary | ICD-10-CM | POA: Diagnosis not present

## 2022-11-23 DIAGNOSIS — Z23 Encounter for immunization: Secondary | ICD-10-CM | POA: Diagnosis not present

## 2022-11-23 LAB — COMPREHENSIVE METABOLIC PANEL
ALT: 13 U/L (ref 0–35)
AST: 19 U/L (ref 0–37)
Albumin: 4.2 g/dL (ref 3.5–5.2)
Alkaline Phosphatase: 60 U/L (ref 39–117)
BUN: 9 mg/dL (ref 6–23)
CO2: 27 mEq/L (ref 19–32)
Calcium: 9 mg/dL (ref 8.4–10.5)
Chloride: 109 mEq/L (ref 96–112)
Creatinine, Ser: 0.62 mg/dL (ref 0.40–1.20)
GFR: 92.93 mL/min (ref >60.00)
Glucose, Bld: 94 mg/dL (ref 70–99)
Potassium: 3.8 mEq/L (ref 3.5–5.1)
Sodium: 144 mEq/L (ref 135–145)
Total Bilirubin: 0.5 mg/dL (ref 0.2–1.2)
Total Protein: 7.4 g/dL (ref 6.0–8.3)

## 2022-11-23 LAB — LIPID PANEL
Cholesterol: 145 mg/dL (ref 0–200)
HDL: 58.3 mg/dL (ref >39.00)
LDL Cholesterol: 75 mg/dL (ref 0–99)
NonHDL: 87.05
Total CHOL/HDL Ratio: 2
Triglycerides: 62 mg/dL (ref 0.0–149.0)
VLDL: 12.4 mg/dL (ref 0.0–40.0)

## 2022-11-23 LAB — HEMOGLOBIN A1C: Hgb A1c MFr Bld: 6 % (ref 4.6–6.5)

## 2022-11-23 NOTE — Assessment & Plan Note (Signed)
Immunizations UTD. Influenza vaccine provided today. Bone density and mammgoram UTD. Colonoscopy UTD, Due 2027  Discussed the importance of a healthy diet and regular exercise in order for weight loss, and to reduce the risk of further co-morbidity.  Exam stable. Labs pending.  Follow up in 1 year for repeat physical.'

## 2022-11-23 NOTE — Progress Notes (Signed)
Subjective:    Patient ID: Madison [name], female    DOB: 1956-06-22, 67 y.o.   MRN: [id number]  HPI  Madison [name] is a very pleasant 67 y.o. female  has a past medical history of Arthritis, Borderline diabetes, Chicken pox, Glaucoma, Heart murmur, Hypertension, Osteopenia, and Seasonal allergies. who presents today for complete physical and follow up of chronic conditions.  Immunizations: -Tetanus: Completed in 2017 -Influenza: Due today -Shingles: Completed Shingrix series -Pneumonia: Completed Prevnar 20 in 2023, pneumovax 23 in 2006  Diet: Dana.  Exercise: No regular exercise.  Eye exam: Completes annually  Dental exam: Completes semi-annually   Mammogram: Completed in July 2023  Colonoscopy: Completed in 2020, due 2027 Dexa: Completed in July 2023, osteopenia   BP Readings from Last 3 Encounters:  11/23/22 138/84  10/23/22 (!) 148/81  04/16/22 120/68        Review of Systems  Constitutional:  Negative for unexpected weight change.  HENT:  Negative for rhinorrhea.   Eyes:  Negative for visual disturbance.  Respiratory:  Negative for cough and shortness of breath.   Cardiovascular:  Negative for chest pain.  Gastrointestinal:  Negative for constipation and diarrhea.  Genitourinary:  Negative for difficulty urinating.  Musculoskeletal:  Positive for arthralgias.  Skin:  Negative for rash.  Allergic/Immunologic: Negative for environmental allergies.  Neurological:  Negative for dizziness and headaches.  Psychiatric/Behavioral:  The patient is not nervous/anxious.          Past Medical History:  Diagnosis Date   Arthritis    Borderline diabetes    Chicken pox    Glaucoma    Heart murmur    Hypertension    Osteopenia    Seasonal allergies     Social History   Socioeconomic History   Marital status: Married    Spouse name: Not on file   Number of children: Not on file   Years of education: Not on file   Highest education level: Not on file   Occupational History   Not on file  Tobacco Use   Smoking status: Never   Smokeless tobacco: Never  Vaping Use   Vaping Use: Never used  Substance and Sexual Activity   Alcohol use: No    Alcohol/week: 0.0 standard drinks of alcohol   Drug use: No   Sexual activity: Not on file  Other Topics Concern   Not on file  Social History Narrative   Married.   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   Social Determinants of Health   Financial Resource Strain: Not on file  Food Insecurity: Not on file  Transportation Needs: Not on file  Physical Activity: Not on file  Stress: Not on file  Social Connections: Not on file  Intimate Partner Violence: Not on file    Past Surgical History:  Procedure Laterality Date   BREAST BIOPSY Right 11/24/2017   rt breast stereo, benign   COLONOSCOPY  12/02/2006   TONSILLECTOMY AND ADENOIDECTOMY  1961    Family History  Problem Relation Age of Onset   Arthritis Mother    Hyperlipidemia Mother    Heart disease Mother    Stroke Mother    Hypertension Mother    Diabetes Father    Diabetes Brother    Arthritis Maternal Grandmother    Diabetes Paternal Grandmother    Esophageal cancer Neg Hx    Colon cancer Neg Hx    Colon polyps Neg Hx  Rectal cancer Neg Hx    Stomach cancer Neg Hx     No Known Allergies  Current Outpatient Medications on File Prior to Visit  Medication Sig Dispense Refill   atorvastatin (LIPITOR) 10 MG tablet TAKE 1 TABLET BY MOUTH EVERY DAY IN THE EVENING FOR CHOLESTEROL 90 tablet 3   CALCIUM PO Take by mouth.     cetirizine (ZYRTEC) 10 MG tablet Take 10 mg by mouth daily.     losartan (COZAAR) 100 MG tablet TAKE 1 TABLET BY MOUTH DAILY. FOR BLOOD PRESSURE. 90 tablet 3   LUMIGAN 0.01 % SOLN INSTILL ONE DROP INTO BOTH EYES AT BEDTIME  6   timolol (TIMOPTIC) 0.25 % ophthalmic solution USE 1 DROP(S) IN BOTH EYES ONCE IN THE MORNING FOR 30 DAYS  6   Turmeric  (QC TUMERIC COMPLEX PO) Take by mouth. Instaflex joint care     vitamin C (ASCORBIC ACID) 500 MG tablet Take 500 mg by mouth daily.     VITAMIN D PO Take by mouth daily. OTC     No current facility-administered medications on file prior to visit.    BP 138/84   Pulse 70   Temp 97.9 F (36.6 C) (Temporal)   Ht '5\' 3"'$  (1.6 m)   Wt 164 lb (74.4 kg)   SpO2 99%   BMI 29.05 kg/m  Objective:   Physical Exam HENT:     Right Ear: Tympanic membrane and ear canal normal.     Left Ear: Tympanic membrane and ear canal normal.     Nose: Nose normal.  Eyes:     Conjunctiva/sclera: Conjunctivae normal.     Pupils: Pupils are equal, round, and reactive to light.  Neck:     Thyroid: No thyromegaly.  Cardiovascular:     Rate and Rhythm: Normal rate and regular rhythm.     Heart sounds: No murmur heard. Pulmonary:     Effort: Pulmonary effort is normal.     Breath sounds: Normal breath sounds. No rales.  Abdominal:     General: Bowel sounds are normal.     Palpations: Abdomen is soft.     Tenderness: There is no abdominal tenderness.  Musculoskeletal:        General: Normal range of motion.     Cervical back: Neck supple.  Lymphadenopathy:     Cervical: No cervical adenopathy.  Skin:    General: Skin is warm and dry.     Findings: No rash.  Neurological:     Mental Status: She is alert and oriented to person, place, and time.     Cranial Nerves: No cranial nerve deficit.     Deep Tendon Reflexes: Reflexes are normal and symmetric.  Psychiatric:        Mood and Affect: Mood normal.           Assessment & Plan:  Preventative health care Assessment & Plan: Immunizations UTD. Influenza vaccine provided today. Bone density and mammgoram UTD. Colonoscopy UTD, Due 2027  Discussed the importance of a healthy diet and regular exercise in order for weight loss, and to reduce the risk of further co-morbidity.  Exam stable. Labs pending.  Follow up in 1 year for repeat  physical.'   Essential hypertension Assessment & Plan: Controlled.  Continue losartan 100 mg daily. CMP pending.   Orders: -     Comprehensive metabolic panel  Osteopenia, unspecified location Assessment & Plan: Reviewed bone density scan results from July 2023.  Continue calcium and vitamin D.  Discussed to start daily weight bearing exercising.   Repeat in July 2025.   Osteoarthritis of multiple joints, unspecified osteoarthritis type Assessment & Plan: Controlled.   Continue to monitor.    Glaucoma, unspecified glaucoma type, unspecified laterality Assessment & Plan: Following with ophthalmology.  Continue Timolol 0.25% drops and Lumigan 0.01% drops.   Hyperlipidemia, unspecified hyperlipidemia type Assessment & Plan: Continue atorvastatin 10 mg daily.  Repeat lipid panel pending.  Orders: -     Lipid panel  Prediabetes Assessment & Plan: Repeat A1C pending.  Discussed the importance of a healthy diet and regular exercise in order for weight loss, and to reduce the risk of further co-morbidity.   Orders: -     Hemoglobin A1c  Need for immunization against influenza -     Flu Vaccine QUAD High Dose(Fluad)        Pleas Koch, NP

## 2022-11-23 NOTE — Assessment & Plan Note (Signed)
Controlled.  Continue to monitor.  

## 2022-11-23 NOTE — Assessment & Plan Note (Signed)
Continue atorvastatin 10 mg daily. Repeat lipid panel pending. 

## 2022-11-23 NOTE — Assessment & Plan Note (Signed)
Following with ophthalmology.  Continue Timolol 0.25% drops and Lumigan 0.01% drops.

## 2022-11-23 NOTE — Assessment & Plan Note (Addendum)
Reviewed bone density scan results from July 2023.  Continue calcium and vitamin D. Discussed to start daily weight bearing exercising.   Repeat in July 2025.

## 2022-11-23 NOTE — Patient Instructions (Signed)
Stop by the lab prior to leaving today. I will notify you of your results once received.  ? ?It was a pleasure to see you today! ? ?Preventive Care 65 Years and Older, Female ?Preventive care refers to lifestyle choices and visits with your health care provider that can promote health and wellness. Preventive care visits are also called wellness exams. ?What can I expect for my preventive care visit? ?Counseling ?Your health care provider may ask you questions about your: ?Medical history, including: ?Past medical problems. ?Family medical history. ?Pregnancy and menstrual history. ?History of falls. ?Current health, including: ?Memory and ability to understand (cognition). ?Emotional well-being. ?Home life and relationship well-being. ?Sexual activity and sexual health. ?Lifestyle, including: ?Alcohol, nicotine or tobacco, and drug use. ?Access to firearms. ?Diet, exercise, and sleep habits. ?Work and work environment. ?Sunscreen use. ?Safety issues such as seatbelt and bike helmet use. ?Physical exam ?Your health care provider will check your: ?Height and weight. These may be used to calculate your BMI (body mass index). BMI is a measurement that tells if you are at a healthy weight. ?Waist circumference. This measures the distance around your waistline. This measurement also tells if you are at a healthy weight and may help predict your risk of certain diseases, such as type 2 diabetes and high blood pressure. ?Heart rate and blood pressure. ?Body temperature. ?Skin for abnormal spots. ?What immunizations do I need? ? ?Vaccines are usually given at various ages, according to a schedule. Your health care provider will recommend vaccines for you based on your age, medical history, and lifestyle or other factors, such as travel or where you work. ?What tests do I need? ?Screening ?Your health care provider may recommend screening tests for certain conditions. This may include: ?Lipid and cholesterol  levels. ?Hepatitis C test. ?Hepatitis B test. ?HIV (human immunodeficiency virus) test. ?STI (sexually transmitted infection) testing, if you are at risk. ?Lung cancer screening. ?Colorectal cancer screening. ?Diabetes screening. This is done by checking your blood sugar (glucose) after you have not eaten for a while (fasting). ?Mammogram. Talk with your health care provider about how often you should have regular mammograms. ?BRCA-related cancer screening. This may be done if you have a family history of breast, ovarian, tubal, or peritoneal cancers. ?Bone density scan. This is done to screen for osteoporosis. ?Talk with your health care provider about your test results, treatment options, and if necessary, the need for more tests. ?Follow these instructions at home: ?Eating and drinking ? ?Eat a diet that includes fresh fruits and vegetables, whole grains, lean protein, and low-fat dairy products. Limit your intake of foods with high amounts of sugar, saturated fats, and salt. ?Take vitamin and mineral supplements as recommended by your health care provider. ?Do not drink alcohol if your health care provider tells you not to drink. ?If you drink alcohol: ?Limit how much you have to 0-1 drink a day. ?Know how much alcohol is in your drink. In the [place] one drink equals one 12 oz bottle of beer (355 mL), one 5 oz glass of wine (148 mL), or one 1? oz glass of hard liquor (44 mL). ?Lifestyle ?Brush your teeth every morning and night with fluoride toothpaste. Floss one time each day. ?Exercise for at least 30 minutes 5 or more days each week. ?Do not use any products that contain nicotine or tobacco. These products include cigarettes, chewing tobacco, and vaping devices, such as e-cigarettes. If you need help quitting, ask your health care provider. ?Do not use   drugs. ?If you are sexually active, practice safe sex. Use a condom or other form of protection in order to prevent STIs. ?Take aspirin only as told by your  health care provider. Make sure that you understand how much to take and what form to take. Work with your health care provider to find out whether it is safe and beneficial for you to take aspirin daily. ?Ask your health care provider if you need to take a cholesterol-lowering medicine (statin). ?Find healthy ways to manage stress, such as: ?Meditation, yoga, or listening to music. ?Journaling. ?Talking to a trusted person. ?Spending time with friends and family. ?Minimize exposure to UV radiation to reduce your risk of skin cancer. ?Safety ?Always wear your seat belt while driving or riding in a vehicle. ?Do not drive: ?If you have been drinking alcohol. Do not ride with someone who has been drinking. ?When you are tired or distracted. ?While texting. ?If you have been using any mind-altering substances or drugs. ?Wear a helmet and other protective equipment during sports activities. ?If you have firearms in your house, make sure you follow all gun safety procedures. ?What's next? ?Visit your health care provider once a year for an annual wellness visit. ?Ask your health care provider how often you should have your eyes and teeth checked. ?Stay up to date on all vaccines. ?This information is not intended to replace advice given to you by your health care provider. Make sure you discuss any questions you have with your health care provider. ?Document Revised: 04/29/2021 Document Reviewed: 04/29/2021 ?Elsevier Patient Education ? 2023 Elsevier Inc[employer] ? ?

## 2022-11-23 NOTE — Assessment & Plan Note (Signed)
Controlled.  Continue losartan 100 mg daily. CMP pending.

## 2022-11-23 NOTE — Assessment & Plan Note (Signed)
Repeat A1C pending.  Discussed the importance of a healthy diet and regular exercise in order for weight loss, and to reduce the risk of further co-morbidity.  

## 2022-11-25 ENCOUNTER — Ambulatory Visit (INDEPENDENT_AMBULATORY_CARE_PROVIDER_SITE_OTHER): Payer: Medicare HMO

## 2022-11-25 VITALS — Ht 63.0 in | Wt 164.0 lb

## 2022-11-25 DIAGNOSIS — Z0000 Encounter for general adult medical examination without abnormal findings: Secondary | ICD-10-CM

## 2022-11-25 NOTE — Patient Instructions (Signed)
Madison [name] , Thank you for taking time to come for your Medicare Wellness Visit. I appreciate your ongoing commitment to your health goals. Please review the following plan we discussed and let me know if I can assist you in the future.   Screening recommendations/referrals: Colonoscopy: 07/02/19 Mammogram: 06/01/22 Bone Density: 06/01/22 Recommended yearly ophthalmology/optometry visit for glaucoma screening and checkup Recommended yearly dental visit for hygiene and checkup  Vaccinations: Influenza vaccine: 11/23/22 Pneumococcal vaccine: 11/19/21 Tdap vaccine: 05/25/16 Shingles vaccine: Shingrix 05/22/19, 08/23/19   Covid-19:01/11/20, 02/01/20, 08/06/21, 08/22/22  Advanced directives: no  Conditions/risks identified: none  Next appointment: Follow up in one year for your annual wellness visit 11/28/23 @ 8:45 am by phone   Preventive Care 65 Years and Older, Female Preventive care refers to lifestyle choices and visits with your health care provider that can promote health and wellness. What does preventive care include? A yearly physical exam. This is also called an annual well check. Dental exams once or twice a year. Routine eye exams. Ask your health care provider how often you should have your eyes checked. Personal lifestyle choices, including: Daily care of your teeth and gums. Regular physical activity. Eating a healthy diet. Avoiding tobacco and drug use. Limiting alcohol use. Practicing safe sex. Taking low-dose aspirin every day. Taking vitamin and mineral supplements as recommended by your health care provider. What happens during an annual well check? The services and screenings done by your health care provider during your annual well check will depend on your age, overall health, lifestyle risk factors, and family history of disease. Counseling  Your health care provider may ask you questions about your: Alcohol use. Tobacco use. Drug use. Emotional well-being. Home and  relationship well-being. Sexual activity. Eating habits. History of falls. Memory and ability to understand (cognition). Work and work Statistician. Reproductive health. Screening  You may have the following tests or measurements: Height, weight, and BMI. Blood pressure. Lipid and cholesterol levels. These may be checked every 5 years, or more frequently if you are over 55 years old. Skin check. Lung cancer screening. You may have this screening every year starting at age 33 if you have a 30-pack-year history of smoking and currently smoke or have quit within the past 15 years. Fecal occult blood test (FOBT) of the stool. You may have this test every year starting at age 64. Flexible sigmoidoscopy or colonoscopy. You may have a sigmoidoscopy every 5 years or a colonoscopy every 10 years starting at age 85. Hepatitis C blood test. Hepatitis B blood test. Sexually transmitted disease (STD) testing. Diabetes screening. This is done by checking your blood sugar (glucose) after you have not eaten for a while (fasting). You may have this done every 1-3 years. Bone density scan. This is done to screen for osteoporosis. You may have this done starting at age 6. Mammogram. This may be done every 1-2 years. Talk to your health care provider about how often you should have regular mammograms. Talk with your health care provider about your test results, treatment options, and if necessary, the need for more tests. Vaccines  Your health care provider may recommend certain vaccines, such as: Influenza vaccine. This is recommended every year. Tetanus, diphtheria, and acellular pertussis (Tdap, Td) vaccine. You may need a Td booster every 10 years. Zoster vaccine. You may need this after age 34. Pneumococcal 13-valent conjugate (PCV13) vaccine. One dose is recommended after age 56. Pneumococcal polysaccharide (PPSV23) vaccine. One dose is recommended after age 39. Talk to  your health care provider  about which screenings and vaccines you need and how often you need them. This information is not intended to replace advice given to you by your health care provider. Make sure you discuss any questions you have with your health care provider. Document Released: 11/28/2015 Document Revised: 07/21/2016 Document Reviewed: 09/02/2015 Elsevier Interactive Patient Education  2017 [place] Prevention in the Home Falls can cause injuries. They can happen to people of all ages. There are many things you can do to make your home safe and to help prevent falls. What can I do on the outside of my home? Regularly fix the edges of walkways and driveways and fix any cracks. Remove anything that might make you trip as you walk through a door, such as a raised step or threshold. Trim any bushes or trees on the path to your home. Use bright outdoor lighting. Clear any walking paths of anything that might make someone trip, such as rocks or tools. Regularly check to see if handrails are loose or broken. Make sure that both sides of any steps have handrails. Any raised decks and porches should have guardrails on the edges. Have any leaves, snow, or ice cleared regularly. Use sand or salt on walking paths during winter. Clean up any spills in your garage right away. This includes oil or grease spills. What can I do in the bathroom? Use night lights. Install grab bars by the toilet and in the tub and shower. Do not use towel bars as grab bars. Use non-skid mats or decals in the tub or shower. If you need to sit down in the shower, use a plastic, non-slip stool. Keep the floor dry. Clean up any water that spills on the floor as soon as it happens. Remove soap buildup in the tub or shower regularly. Attach bath mats securely with double-sided non-slip rug tape. Do not have throw rugs and other things on the floor that can make you trip. What can I do in the bedroom? Use night lights. Make sure  that you have a light by your bed that is easy to reach. Do not use any sheets or blankets that are too big for your bed. They should not hang down onto the floor. Have a firm chair that has side arms. You can use this for support while you get dressed. Do not have throw rugs and other things on the floor that can make you trip. What can I do in the kitchen? Clean up any spills right away. Avoid walking on wet floors. Keep items that you use a lot in easy-to-reach places. If you need to reach something above you, use a strong step stool that has a grab bar. Keep electrical cords out of the way. Do not use floor polish or wax that makes floors slippery. If you must use wax, use non-skid floor wax. Do not have throw rugs and other things on the floor that can make you trip. What can I do with my stairs? Do not leave any items on the stairs. Make sure that there are handrails on both sides of the stairs and use them. Fix handrails that are broken or loose. Make sure that handrails are as long as the stairways. Check any carpeting to make sure that it is firmly attached to the stairs. Fix any carpet that is loose or worn. Avoid having throw rugs at the top or bottom of the stairs. If you do have throw rugs, attach  them to the floor with carpet tape. Make sure that you have a light switch at the top of the stairs and the bottom of the stairs. If you do not have them, ask someone to add them for you. What else can I do to help prevent falls? Wear shoes that: Do not have high heels. Have rubber bottoms. Are comfortable and fit you well. Are closed at the toe. Do not wear sandals. If you use a stepladder: Make sure that it is fully opened. Do not climb a closed stepladder. Make sure that both sides of the stepladder are locked into place. Ask someone to hold it for you, if possible. Clearly mark and make sure that you can see: Any grab bars or handrails. First and last steps. Where the edge of  each step is. Use tools that help you move around (mobility aids) if they are needed. These include: Canes. Walkers. Scooters. Crutches. Turn on the lights when you go into a dark area. Replace any light bulbs as soon as they burn out. Set up your furniture so you have a clear path. Avoid moving your furniture around. If any of your floors are uneven, fix them. If there are any pets around you, be aware of where they are. Review your medicines with your doctor. Some medicines can make you feel dizzy. This can increase your chance of falling. Ask your doctor what other things that you can do to help prevent falls. This information is not intended to replace advice given to you by your health care provider. Make sure you discuss any questions you have with your health care provider. Document Released: 08/28/2009 Document Revised: 04/08/2016 Document Reviewed: 12/06/2014 Elsevier Interactive Patient Education  2017 [employer].

## 2022-11-25 NOTE — Progress Notes (Signed)
Virtual Visit via Telephone Note  I connected with  Dionne Milo on 11/25/22 at  9:15 AM EST by telephone and verified that I am speaking with the correct person using two identifiers.  Location: Patient: home Provider: [place]  Persons participating in the virtual visit: [place]   I discussed the limitations, risks, security and privacy concerns of performing an evaluation and management service by telephone and the availability of in person appointments. The patient expressed understanding and agreed to proceed.  Interactive audio and video telecommunications were attempted between this nurse and patient, however failed, due to patient having technical difficulties OR patient did not have access to video capability.  We continued and completed visit with audio only.  Some vital signs may be absent or patient reported.   Dionisio David, LPN  Subjective:   Anairis Knick is a 67 y.o. female who presents for Medicare Annual (Subsequent) preventive examination.  Review of Systems     Cardiac Risk Factors include: advanced age (>22mn, >>59women);hypertension     Objective:    There were no vitals filed for this visit. There is no height or weight on file to calculate BMI.     11/25/2022    9:17 AM  Advanced Directives  Does Patient Have a Medical Advance Directive? No  Would patient like information on creating a medical advance directive? No - Patient declined    Current Medications (verified) Outpatient Encounter Medications as of 11/25/2022  Medication Sig   atorvastatin (LIPITOR) 10 MG tablet TAKE 1 TABLET BY MOUTH EVERY DAY IN THE EVENING FOR CHOLESTEROL   CALCIUM PO Take by mouth.   cetirizine (ZYRTEC) 10 MG tablet Take 10 mg by mouth daily.   losartan (COZAAR) 100 MG tablet TAKE 1 TABLET BY MOUTH DAILY. FOR BLOOD PRESSURE.   LUMIGAN 0.01 % SOLN INSTILL ONE DROP INTO BOTH EYES AT BEDTIME   timolol (TIMOPTIC) 0.25 % ophthalmic solution USE 1  DROP(S) IN BOTH EYES ONCE IN THE MORNING FOR 30 DAYS   Turmeric (QC TUMERIC COMPLEX PO) Take by mouth. Instaflex joint care   vitamin C (ASCORBIC ACID) 500 MG tablet Take 500 mg by mouth daily.   VITAMIN D PO Take by mouth daily. OTC   No facility-administered encounter medications on file as of 11/25/2022.    Allergies (verified) Patient has no known allergies.   History: Past Medical History:  Diagnosis Date   Arthritis    Borderline diabetes    Chicken pox    Glaucoma    Heart murmur    Hypertension    Osteopenia    Seasonal allergies    Past Surgical History:  Procedure Laterality Date   BREAST BIOPSY Right 11/24/2017   rt breast stereo, benign   COLONOSCOPY  12/02/2006   TONSILLECTOMY AND ADENOIDECTOMY  1961   Family History  Problem Relation Age of Onset   Arthritis Mother    Hyperlipidemia Mother    Heart disease Mother    Stroke Mother    Hypertension Mother    Diabetes Father    Diabetes Brother    Arthritis Maternal Grandmother    Diabetes Paternal Grandmother    Esophageal cancer Neg Hx    Colon cancer Neg Hx    Colon polyps Neg Hx    Rectal cancer Neg Hx    Stomach cancer Neg Hx    Social History   Socioeconomic History   Marital status: Married    Spouse name: Not on file  Number of children: Not on file   Years of education: Not on file   Highest education level: Not on file  Occupational History   Not on file  Tobacco Use   Smoking status: Never   Smokeless tobacco: Never  Vaping Use   Vaping Use: Never used  Substance and Sexual Activity   Alcohol use: No    Alcohol/week: 0.0 standard drinks of alcohol   Drug use: No   Sexual activity: Not on file  Other Topics Concern   Not on file  Social History Narrative   Married.   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   Social Determinants of Health   Financial Resource Strain: Low Risk  (11/25/2022)   Overall Financial Resource  Strain (CARDIA)    Difficulty of Paying Living Expenses: Not hard at all  Food Insecurity: No Food Insecurity (11/25/2022)   Hunger Vital Sign    Worried About Running Out of Food in the Last Year: Never true    Ran Out of Food in the Last Year: Never true  Transportation Needs: No Transportation Needs (11/25/2022)   PRAPARE - Hydrologist (Medical): No    Lack of Transportation (Non-Medical): No  Physical Activity: Inactive (11/25/2022)   Exercise Vital Sign    Days of Exercise per Week: 0 days    Minutes of Exercise per Session: 0 min  Stress: No Stress Concern Present (11/25/2022)   [place]    Feeling of Stress : Only a little  Social Connections: Moderately Integrated (11/25/2022)   Social Connection and Isolation Panel [NHANES]    Frequency of Communication with Friends and Family: More than three times a week    Frequency of Social Gatherings with Friends and Family: Once a week    Attends Religious Services: More than 4 times per year    Active Member of [employer] or Organizations: No    Attends Music therapist: Never    Marital Status: Married    Tobacco Counseling Counseling given: Not Answered   Clinical Intake:  Pre-visit preparation completed: Yes  Pain : No/denies pain     Nutritional Risks: None Diabetes: No  How often do you need to have someone help you when you read instructions, pamphlets, or other written materials from your doctor or pharmacy?: 1 - Never  Diabetic?no  Interpreter Needed?: No  Information entered by :: Kirke Shaggy, LPN   Activities of Daily Living    11/25/2022    9:17 AM  In your present state of health, do you have any difficulty performing the following activities:  Hearing? 0  Vision? 0  Difficulty concentrating or making decisions? 0  Walking or climbing stairs? 0  Dressing or bathing? 0  Doing errands, shopping? 0   Preparing Food and eating ? N  Using the Toilet? N  In the past six months, have you accidently leaked urine? N  Do you have problems with loss of bowel control? N  Managing your Medications? N  Managing your Finances? N  Housekeeping or managing your Housekeeping? N    Patient Care Team: Pleas Koch, NP as PCP - General (Nurse Practitioner)  Indicate any recent Medical Services you may have received from other than [facility] providers in the past year (date may be approximate).     Assessment:   This is a routine wellness examination for [place].  Hearing/Vision screen Hearing Screening - Comments:: No aids Vision Screening - Comments:: Wears glasses- Dr.King  Dietary issues and exercise activities discussed: Current Exercise Habits: The patient does not participate in regular exercise at present   Goals Addressed             This Visit's Progress    DIET - EAT MORE FRUITS AND VEGETABLES         Depression Screen    11/25/2022    9:15 AM 11/23/2022    8:53 AM 11/19/2021    8:24 AM 10/14/2020   10:40 AM 12/19/2017    9:01 AM  PHQ 2/9 Scores  PHQ - 2 Score 0 0 0 0 0  PHQ- 9 Score 0  0  0    Fall Risk    11/25/2022    9:17 AM 11/23/2022    8:53 AM 11/19/2021    8:47 AM 11/19/2021    8:25 AM 10/14/2020   10:40 AM  Fall Risk   Falls in the past year? 0 0 0 0 0  Number falls in past yr: 0 0 0 0 0  Injury with Fall? 0 0 0 0 0  Risk for fall due to : No Fall Risks No Fall Risks     Follow up Falls prevention discussed;Falls evaluation completed Falls evaluation completed       FALL RISK PREVENTION PERTAINING TO THE HOME:  Any stairs in or around the home? Yes  If so, are there any without handrails? No  Home free of loose throw rugs in walkways, pet beds, electrical cords, etc? Yes  Adequate lighting in your home to reduce risk of falls? Yes   ASSISTIVE DEVICES UTILIZED TO PREVENT FALLS:  Life alert? No  Use of a cane, walker or w/c? No  Grab bars in the  bathroom? No  Shower chair or bench in shower? Yes  Elevated toilet seat or a handicapped toilet? No    Cognitive Function:        11/25/2022    9:21 AM  6CIT Screen  What Year? 0 points  What month? 0 points  What time? 0 points  Count back from 20 0 points  Months in reverse 0 points  Repeat phrase 0 points  Total Score 0 points    Immunizations Immunization History  Administered Date(s) Administered   COVID-19, mRNA, vaccine(Comirnaty)12 years and older 08/22/2022   Fluad Quad(high Dose 65+) 11/23/2022   Influenza,inj,Quad PF,6+ Mos 08/23/2019, 10/14/2020   Influenza-Unspecified 08/06/2021   PFIZER(Purple Top)SARS-COV-2 Vaccination 01/11/2020, 02/01/2020, 08/06/2021   PNEUMOCOCCAL CONJUGATE-20 11/19/2021   Pneumococcal Polysaccharide-23 11/11/2005   Td 11/11/2005, 05/25/2016   Zoster Recombinat (Shingrix) 05/22/2019, 08/23/2019    TDAP status: Up to date  Flu Vaccine status: Up to date  Pneumococcal vaccine status: Up to date  Covid-19 vaccine status: Completed vaccines  Qualifies for Shingles Vaccine? Yes   Zostavax completed No   Shingrix Completed?: Yes  Screening Tests Health Maintenance  Topic Date Due   Medicare Annual Wellness (AWV)  11/26/2023   MAMMOGRAM  06/01/2024   DTaP/Tdap/Td (3 - Tdap) 05/25/2026   COLONOSCOPY (Pts 45-64yr Insurance coverage will need to be confirmed)  07/01/2026   Pneumonia Vaccine 67 Years old  Completed   INFLUENZA VACCINE  Completed   DEXA SCAN  Completed   Hepatitis C Screening  Completed   Zoster Vaccines- Shingrix  Completed   HPV VACCINES  Aged Out   COVID-19 Vaccine  Discontinued    Health Maintenance  There  are no preventive care reminders to display for this patient.   Colorectal cancer screening: Type of screening: Colonoscopy. Completed 07/02/19. Repeat every 5 years  Mammogram status: Completed 06/01/22. Repeat every year  Bone Density status: Completed 06/01/22. Results reflect: Bone density  results: OSTEOPENIA. Repeat every 2 years.  Lung Cancer Screening: (Low Dose CT Chest recommended if Age 3-80 years, 30 pack-year currently smoking OR have quit w/in 15years.) does not qualify.   Additional Screening:  Hepatitis C Screening: does qualify; Completed 09/07/17  Vision Screening: Recommended annual ophthalmology exams for early detection of glaucoma and other disorders of the eye. Is the patient up to date with their annual eye exam?  Yes  Who is the provider or what is the name of the office in which the patient attends annual eye exams? Dr.King If pt is not established with a provider, would they like to be referred to a provider to establish care? No .   Dental Screening: Recommended annual dental exams for proper oral hygiene  Community Resource Referral / Chronic Care Management: CRR required this visit?  No   CCM required this visit?  No      Plan:     I have personally reviewed and noted the following in the patient's chart:   Medical and social history Use of alcohol, tobacco or illicit drugs  Current medications and supplements including opioid prescriptions. Patient is not currently taking opioid prescriptions. Functional ability and status Nutritional status Physical activity Advanced directives List of other physicians Hospitalizations, surgeries, and ER visits in previous 12 months Vitals Screenings to include cognitive, depression, and falls Referrals and appointments  In addition, I have reviewed and discussed with patient certain preventive protocols, quality metrics, and best practice recommendations. A written personalized care plan for preventive services as well as general preventive health recommendations were provided to patient.     Dionisio David, LPN   5/40/0867   Nurse Notes: none

## 2022-11-26 ENCOUNTER — Other Ambulatory Visit: Payer: Self-pay | Admitting: Primary Care

## 2022-11-26 DIAGNOSIS — E785 Hyperlipidemia, unspecified: Secondary | ICD-10-CM

## 2022-11-26 DIAGNOSIS — I10 Essential (primary) hypertension: Secondary | ICD-10-CM

## 2023-03-30 IMAGING — DX DG CHEST 2V
2 series · 2 of 2 positions shown · non-contrast
Comparison: None Available.

CLINICAL DATA: Cough, sinus drainage and chest tightness for 4
weeks.

EXAM:
CHEST - 2 VIEW

[chest pa]
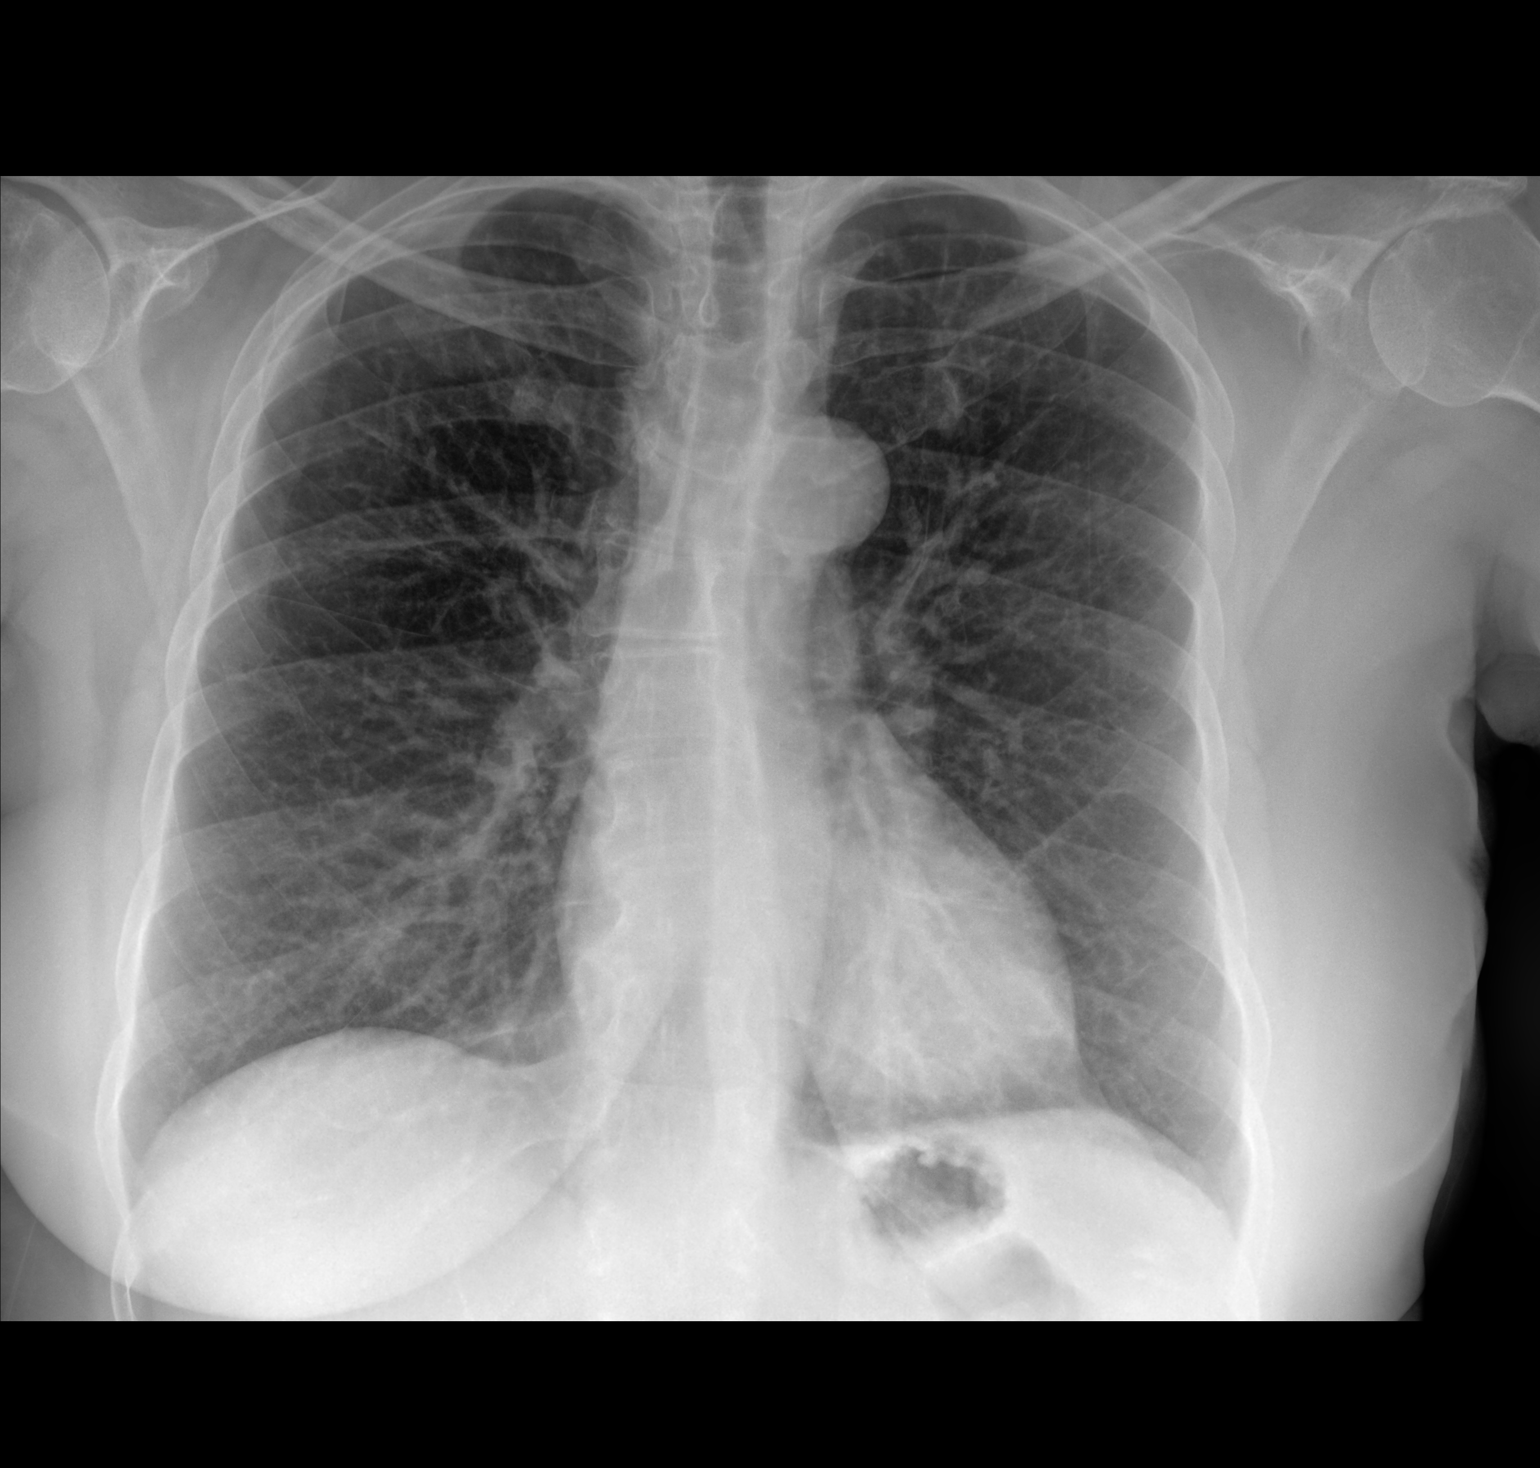

[chest lat]
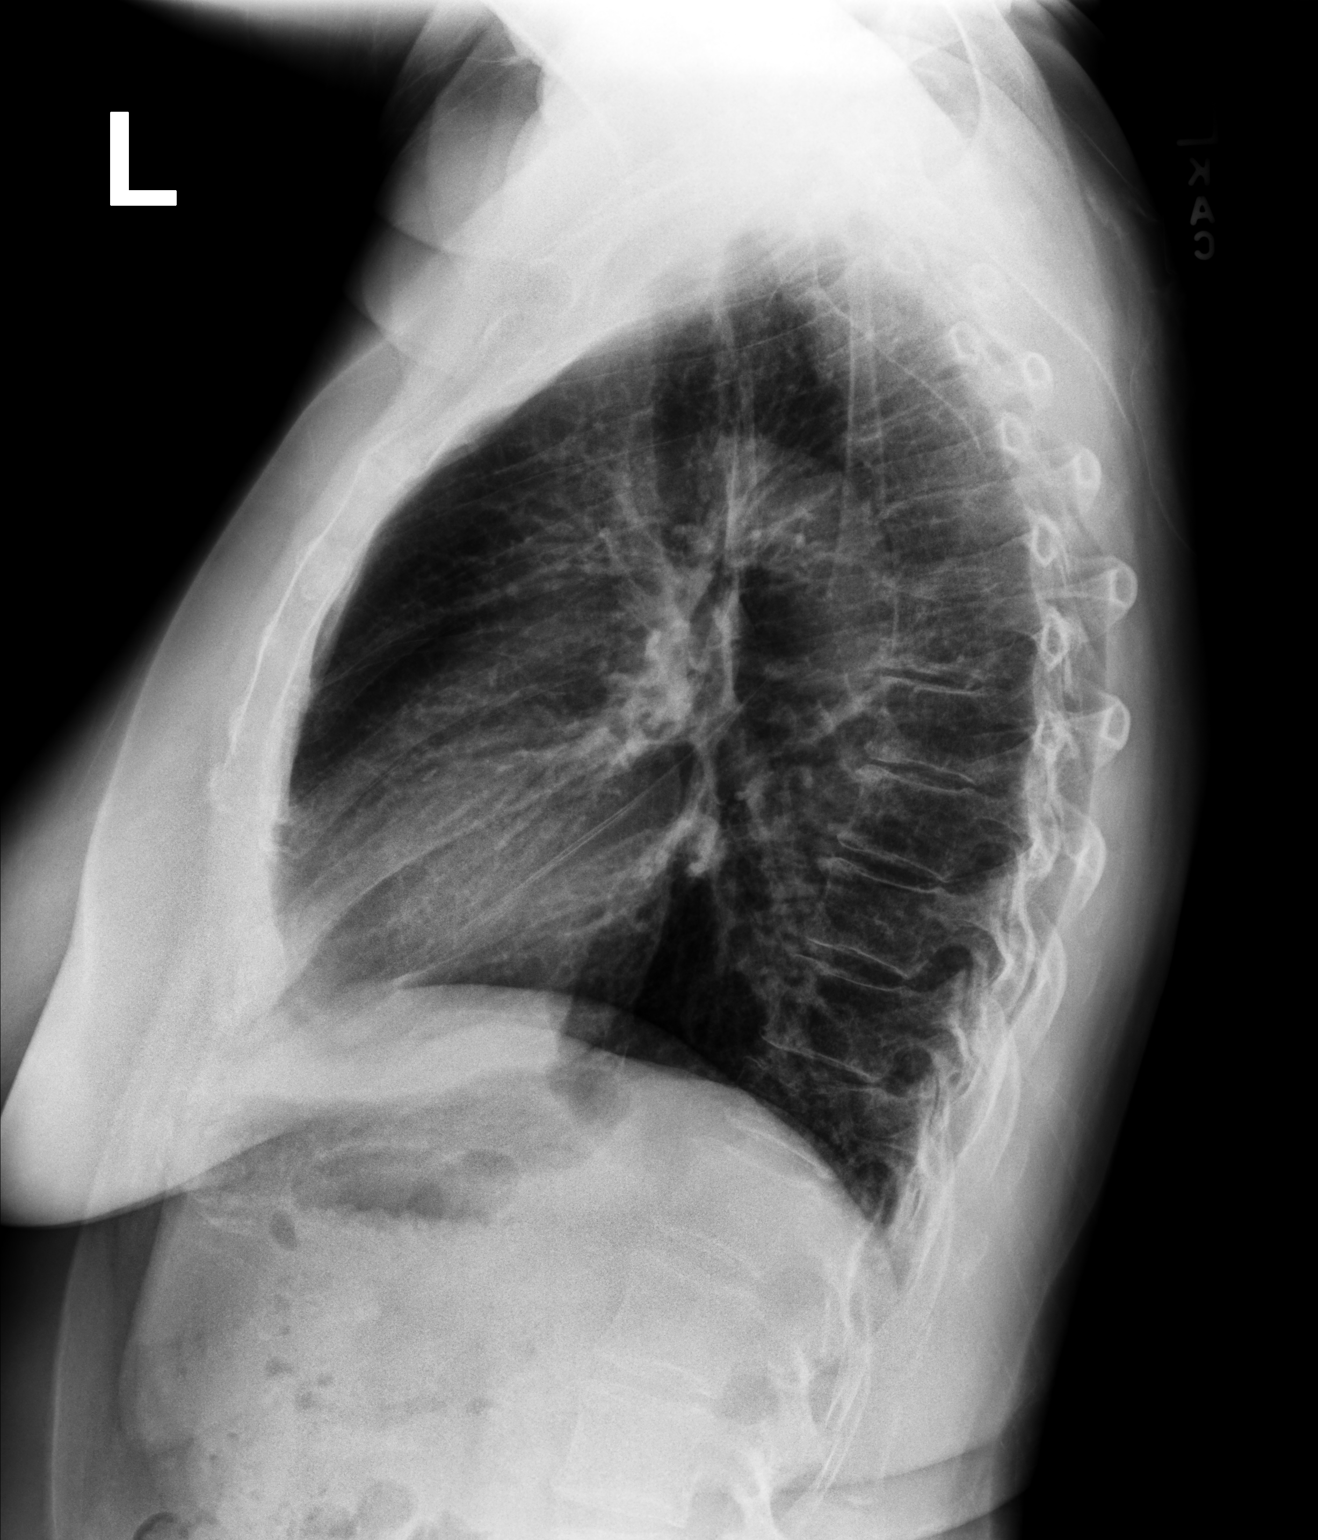

[2 of 2 positions shown; findings below may reference images not displayed]

FINDINGS: The heart size and mediastinal contours are within normal limits.
Both lungs are clear. The visualized skeletal structures are
unremarkable.
IMPRESSION: No active cardiopulmonary disease.

## 2023-05-31 ENCOUNTER — Other Ambulatory Visit: Payer: Self-pay | Admitting: Primary Care

## 2023-05-31 DIAGNOSIS — Z1231 Encounter for screening mammogram for malignant neoplasm of breast: Secondary | ICD-10-CM

## 2023-06-03 ENCOUNTER — Ambulatory Visit
Admission: RE | Admit: 2023-06-03 | Discharge: 2023-06-03 | Disposition: A | Discharge status: Home / Self Care | Payer: Medicare HMO | Source: Ambulatory Visit | Attending: Primary Care | Admitting: Primary Care

## 2023-06-03 DIAGNOSIS — Z1231 Encounter for screening mammogram for malignant neoplasm of breast: Secondary | ICD-10-CM

## 2023-08-25 ENCOUNTER — Other Ambulatory Visit: Payer: Self-pay | Admitting: Primary Care

## 2023-08-25 DIAGNOSIS — I10 Essential (primary) hypertension: Secondary | ICD-10-CM

## 2023-08-25 DIAGNOSIS — E785 Hyperlipidemia, unspecified: Secondary | ICD-10-CM

## 2023-08-25 NOTE — Telephone Encounter (Signed)
Lvm for patient tcb and schedule 

## 2023-08-25 NOTE — Telephone Encounter (Signed)
Patient is due for CPE/follow up in mid January 2025, this will be required prior to any further refills.  Please schedule, thank you!

## 2023-11-15 ENCOUNTER — Other Ambulatory Visit: Payer: Self-pay | Admitting: Primary Care

## 2023-11-15 DIAGNOSIS — I10 Essential (primary) hypertension: Secondary | ICD-10-CM

## 2023-11-15 DIAGNOSIS — E785 Hyperlipidemia, unspecified: Secondary | ICD-10-CM

## 2023-11-15 NOTE — Telephone Encounter (Signed)
Patient is due for CPE/follow up in mid January 2025, this will be required prior to any further refills.  Please schedule, thank you!

## 2023-11-17 NOTE — Telephone Encounter (Signed)
 Spoke to pt, pt stated she currently has a new pt visit scheduled with a different office & will be switching providers. Pt stated she loves [facility] but feels as if the new office will be a better fit for her care. Pt stated she will no longer need refills from [facility].

## 2023-11-17 NOTE — Telephone Encounter (Signed)
Noted. Will remove myself as PCP. 

## 2023-12-10 ENCOUNTER — Other Ambulatory Visit: Payer: Self-pay | Admitting: Primary Care

## 2023-12-10 DIAGNOSIS — I10 Essential (primary) hypertension: Secondary | ICD-10-CM

## 2023-12-10 DIAGNOSIS — E785 Hyperlipidemia, unspecified: Secondary | ICD-10-CM

## 2024-04-03 ENCOUNTER — Encounter: Payer: Self-pay | Admitting: Allergy and Immunology

## 2024-04-03 ENCOUNTER — Ambulatory Visit: Payer: Self-pay | Admitting: Allergy and Immunology

## 2024-04-03 ENCOUNTER — Other Ambulatory Visit: Payer: Self-pay

## 2024-04-03 VITALS — BP 134/86 | HR 68 | Temp 98.2°F | Ht 63.78 in | Wt 164.3 lb

## 2024-04-03 DIAGNOSIS — H1013 Acute atopic conjunctivitis, bilateral: Secondary | ICD-10-CM | POA: Diagnosis not present

## 2024-04-03 DIAGNOSIS — H1010 Acute atopic conjunctivitis, unspecified eye: Secondary | ICD-10-CM

## 2024-04-03 DIAGNOSIS — J301 Allergic rhinitis due to pollen: Secondary | ICD-10-CM

## 2024-04-03 DIAGNOSIS — J3089 Other allergic rhinitis: Secondary | ICD-10-CM | POA: Diagnosis not present

## 2024-04-03 DIAGNOSIS — H6993 Unspecified Eustachian tube disorder, bilateral: Secondary | ICD-10-CM | POA: Diagnosis not present

## 2024-04-03 MED ORDER — RYALTRIS 665-25 MCG/ACT NA SUSP: 2.0000 | Freq: Two times a day (BID) | NASAL | 1 refills | Status: DC

## 2024-04-03 MED ORDER — CETIRIZINE HCL 10 MG PO TABS: 10.0000 mg | ORAL_TABLET | Freq: Every day | ORAL | 1 refills | Status: DC | PRN

## 2024-04-03 MED ORDER — OLOPATADINE HCL 0.2 % OP SOLN: 1.0000 [drp] | OPHTHALMIC | 1 refills | Status: DC

## 2024-04-03 NOTE — Patient Instructions (Addendum)
  1. Allergen avoidance measures - sports glasses  2. Never, ever rub / touch eyes  3. Treat and prevent inflammation of airway:   A. Ryaltris - 2 sprays each nostril 1-2 times per day (SP)  4. If needed:   A. Cetirizine 10 mg - 1 tablet 1 time per day  B. Pataday - 1 drop each eye 1` time per day  C. Gentle autoinflation of ears with positive pressure technique  5. Return to clinic in 4 weeks   6. Immunotherapy??? Would need skin testing if needed  7. Influenza - Tamiflu . Covid = Paxlovid

## 2024-04-03 NOTE — Progress Notes (Signed)
 [place] - [place] - [place] - [place] - [place]   NEW PATIENT NOTE  Referring Provider: No ref. provider found Primary Provider: No primary care provider on file. Date of office visit: 04/03/2024    Subjective:   Chief Complaint:  Madison [name] (DOB: 20-Aug-1956) is a 68 y.o. female who presents to the clinic on 04/03/2024 with a chief complaint of Allergic Rhinitis  and Establish Care .     HPI: Madison [name] presents to this clinic in evaluation of allergies.  She has a long history of allergic rhinoconjunctivitis with nasal congestion and sneezing and itchy eyes and ear fullness and ear plugging and ear itchiness occurring on a perennial basis with flare during the spring and fall for the past 25 years.  She has tried a multitude of various agents to treat this issue including an attempt at using nasal steroids, oral antihistamines, and she completed 6 months of immunotherapy in 2001.  Provoking factors for symptoms include exposure to the outside.  Past Medical History:  Diagnosis Date   Arthritis    Borderline diabetes    Chicken pox    Glaucoma    Heart murmur    Hypertension    Osteopenia    Seasonal allergies     Past Surgical History:  Procedure Laterality Date   BREAST BIOPSY Right 11/24/2017   rt breast stereo, benign   COLONOSCOPY  12/02/2006   TONSILLECTOMY AND ADENOIDECTOMY  1961    Allergies as of 04/03/2024   No Known Allergies      Medication List    ascorbic acid 500 MG tablet Commonly known as: VITAMIN C Take 500 mg by mouth daily.   atorvastatin  10 MG tablet Commonly known as: LIPITOR TAKE 1 TABLET BY MOUTH EVERY DAY IN THE EVENING FOR CHOLESTEROL   CALCIUM  PO Take by mouth.   cetirizine 10 MG tablet Commonly known as: ZYRTEC Take 10 mg by mouth daily.   fluticasone 50 MCG/ACT nasal spray Commonly known as: FLONASE Place 2 sprays into both nostrils daily.   losartan  100 MG tablet Commonly known as: COZAAR  TAKE 1 TABLET BY MOUTH  EVERY DAY FOR BLOOD PRESSURE   Lumigan 0.01 % Soln Generic drug: bimatoprost INSTILL ONE DROP INTO BOTH EYES AT BEDTIME   QC TUMERIC COMPLEX PO Take by mouth. Instaflex joint care   timolol 0.25 % ophthalmic solution Commonly known as: TIMOPTIC USE 1 DROP(S) IN BOTH EYES ONCE IN THE MORNING FOR 30 DAYS   VITAMIN D  PO Take by mouth daily. OTC    Review of systems negative except as noted in HPI / PMHx or noted below:  Review of Systems  Constitutional: Negative.   HENT: Negative.    Eyes: Negative.   Respiratory: Negative.    Cardiovascular: Negative.   Gastrointestinal: Negative.   Genitourinary: Negative.   Musculoskeletal: Negative.   Skin: Negative.   Neurological: Negative.   Endo/Heme/Allergies: Negative.   Psychiatric/Behavioral: Negative.      Family History  Problem Relation Age of Onset   Arthritis Mother    Hyperlipidemia Mother    Heart disease Mother    Stroke Mother    Hypertension Mother    Diabetes Father    Allergic rhinitis Brother    Diabetes Brother    Arthritis Maternal Grandmother    Diabetes Paternal Grandmother    Esophageal cancer Neg Hx    Colon cancer Neg Hx    Colon polyps Neg Hx    Rectal cancer Neg Hx    Stomach  cancer Neg Hx     Social History   Socioeconomic History   Marital status: Married    Spouse name: Not on file   Number of children: Not on file   Years of education: Not on file   Highest education level: Not on file  Occupational History   Not on file  Tobacco Use   Smoking status: Never    Passive exposure: Never   Smokeless tobacco: Never  Vaping Use   Vaping status: Never Used  Substance and Sexual Activity   Alcohol use: No    Alcohol/week: 0.0 standard drinks of alcohol   Drug use: No   Sexual activity: Not on file  Other Topics Concern   Not on file  Social History Narrative   Married.   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   Social  Drivers of Corporate investment banker Strain: Low Risk  (11/25/2022)   Overall Financial Resource Strain (CARDIA)    Difficulty of Paying Living Expenses: Not hard at all  Food Insecurity: No Food Insecurity (11/25/2022)   Hunger Vital Sign    Worried About Running Out of Food in the Last Year: Never true    Ran Out of Food in the Last Year: Never true  Transportation Needs: No Transportation Needs (11/25/2022)   PRAPARE - Administrator, Civil Service (Medical): No    Lack of Transportation (Non-Medical): No  Physical Activity: Inactive (11/25/2022)   Exercise Vital Sign    Days of Exercise per Week: 0 days    Minutes of Exercise per Session: 0 min  Stress: No Stress Concern Present (11/25/2022)   [employer] of Occupational Health - Occupational Stress Questionnaire    Feeling of Stress : Only a little  Social Connections: Moderately Integrated (11/25/2022)   Social Connection and Isolation Panel [NHANES]    Frequency of Communication with Friends and Family: More than three times a week    Frequency of Social Gatherings with Friends and Family: Once a week    Attends Religious Services: More than 4 times per year    Active Member of [employer] or Organizations: No    Attends Banker Meetings: Never    Marital Status: Married  Catering manager Violence: Not At Risk (11/25/2022)   Humiliation, Afraid, Rape, and Kick questionnaire    Fear of Current or Ex-Partner: No    Emotionally Abused: No    Physically Abused: No    Sexually Abused: No    Environmental and Social history  Lives in a house with a dry environment, dog located inside the household, carpet in the bedroom, plastic on the bed, no plastic on the pillow, no smoking ongoing with inside the household.  Objective:   Vitals:   04/03/24 1404  BP: 134/86  Pulse: 68  Temp: 98.2 F (36.8 C)  SpO2: 97%   Height: 5' 3.78" (162 cm) Weight: 164 lb 4.8 oz (74.5 kg)  Physical  Exam Constitutional:      Appearance: She is not diaphoretic.  HENT:     Head: Normocephalic.     Right Ear: Tympanic membrane, ear canal and external ear normal.     Left Ear: Tympanic membrane, ear canal and external ear normal.     Nose: Nose normal. No mucosal edema or rhinorrhea.     Mouth/Throat:     Pharynx: Uvula midline. No oropharyngeal exudate.  Eyes:     Conjunctiva/sclera:  Conjunctivae normal.  Neck:     Thyroid : No thyromegaly.     Trachea: Trachea normal. No tracheal tenderness or tracheal deviation.  Cardiovascular:     Rate and Rhythm: Normal rate and regular rhythm.     Heart sounds: Normal heart sounds, S1 normal and S2 normal. No murmur heard. Pulmonary:     Effort: No respiratory distress.     Breath sounds: Normal breath sounds. No stridor. No wheezing or rales.  Lymphadenopathy:     Head:     Right side of head: No tonsillar adenopathy.     Left side of head: No tonsillar adenopathy.     Cervical: No cervical adenopathy.  Skin:    Findings: No erythema or rash.     Nails: There is no clubbing.  Neurological:     Mental Status: She is alert.     Diagnostics: Allergy skin tests were not performed.     Assessment and Plan:    1. Perennial allergic rhinitis   2. Seasonal allergic rhinitis due to pollen   3. Seasonal allergic conjunctivitis   4. Dysfunction of both eustachian tubes    1. Allergen avoidance measures - sports glasses  2. Never, ever rub / touch eyes  3. Treat and prevent inflammation of airway:   A. Ryaltris - 2 sprays each nostril 1-2 times per day (SP)  4. If needed:   A. Cetirizine 10 mg - 1 tablet 1 time per day  B. Pataday - 1 drop each eye 1` time per day  C. Gentle autoinflation of ears with positive pressure technique  5. Return to clinic in 4 weeks   6. Immunotherapy??? Would need skin testing if needed  7. Influenza - Tamiflu . Covid = Paxlovid  Madison [name] will use a combination of anti-inflammatory agents for her  airway and some symptomatic medications along with therapy for her ETD as noted above and we will see what happens over the course of the next 4 weeks or so.  If she fails medical therapy she would definitely be a candidate for immunotherapy.  Fabienne Holter, MD Allergy / Immunology [facility] 

## 2024-04-04 ENCOUNTER — Encounter: Payer: Self-pay | Admitting: Allergy and Immunology

## 2024-05-01 ENCOUNTER — Other Ambulatory Visit: Payer: Self-pay | Admitting: Registered Nurse

## 2024-05-01 DIAGNOSIS — Z1231 Encounter for screening mammogram for malignant neoplasm of breast: Secondary | ICD-10-CM

## 2024-05-08 ENCOUNTER — Ambulatory Visit: Admitting: Allergy and Immunology

## 2024-05-08 VITALS — BP 136/88 | HR 67 | Temp 98.1°F | Resp 14 | Ht 63.0 in | Wt 162.9 lb

## 2024-05-08 DIAGNOSIS — J301 Allergic rhinitis due to pollen: Secondary | ICD-10-CM | POA: Diagnosis not present

## 2024-05-08 DIAGNOSIS — H1013 Acute atopic conjunctivitis, bilateral: Secondary | ICD-10-CM | POA: Diagnosis not present

## 2024-05-08 DIAGNOSIS — H1010 Acute atopic conjunctivitis, unspecified eye: Secondary | ICD-10-CM

## 2024-05-08 DIAGNOSIS — J3089 Other allergic rhinitis: Secondary | ICD-10-CM | POA: Diagnosis not present

## 2024-05-08 DIAGNOSIS — H6993 Unspecified Eustachian tube disorder, bilateral: Secondary | ICD-10-CM

## 2024-05-08 DIAGNOSIS — J324 Chronic pansinusitis: Secondary | ICD-10-CM

## 2024-05-08 MED ORDER — CETIRIZINE HCL 10 MG PO TABS: 10.0000 mg | ORAL_TABLET | Freq: Every day | ORAL | 1 refills | Status: DC | PRN

## 2024-05-08 MED ORDER — RYALTRIS 665-25 MCG/ACT NA SUSP: 2.0000 | Freq: Two times a day (BID) | NASAL | 1 refills | Status: DC

## 2024-05-08 MED ORDER — OLOPATADINE HCL 0.2 % OP SOLN
1.0000 [drp] | OPHTHALMIC | 1 refills | Status: DC
Start: 2024-05-08 — End: 2024-05-15

## 2024-05-08 NOTE — Progress Notes (Unsigned)
 [place] [facility] [place] [facility] [place] - [place] [facility] [place]   Follow-up Note  Referring Provider: No ref. provider found Primary Provider: No primary care provider on file. Date of Office Visit: 05/08/2024  Subjective:   Madison [name] (DOB: 1956/02/04) is a 68 y.o. female who [facility] in re-evaluation of the following:  HPI: Madison [name] returns to this clinic in evaluation of allergic rhinoconjunctivitis and ETD.  I last saw her in this clinic during her initial evaluation on 03 Apr 2024.  She has had some minimal improvement regarding her chronic nasal congestion and sneezing and mucus production postnasal drip and ear fullness and ear plugging while utilizing right Ultracin twice a day and taking an antihistamine and performing some pneumatic inflation of her eustachian tubes.  She has already had 2 antibiotics administered in 2025 for her sinus issues.  Allergies as of 05/08/2024   No Known Allergies      Medication List    atorvastatin  10 MG tablet Commonly known as: LIPITOR TAKE 1 TABLET BY MOUTH EVERY DAY IN THE EVENING FOR CHOLESTEROL   CALCIUM  PO Take by mouth.   cetirizine  10 MG tablet Commonly known as: ZYRTEC  Take 1 tablet (10 mg total) by mouth daily as needed for allergies (Can take an extra dose during flare ups.).   losartan  100 MG tablet Commonly known as: COZAAR  TAKE 1 TABLET BY MOUTH EVERY DAY FOR BLOOD PRESSURE   loteprednol 0.5 % ophthalmic suspension Commonly known as: LOTEMAX Place 1 drop into both eyes 2 (two) times daily.   Lumigan 0.01 % Soln Generic drug: bimatoprost INSTILL ONE DROP INTO BOTH EYES AT BEDTIME   Olopatadine  HCl 0.2 % Soln Commonly known as: Pataday  Place 1 drop into both eyes 1 day or 1 dose.   QC TUMERIC COMPLEX PO Take by mouth. Instaflex joint care   Ryaltris  665-25 MCG/ACT Susp Generic drug: Olopatadine -Mometasone Place 2 sprays into the nose in the morning and at  bedtime.   timolol 0.25 % ophthalmic solution Commonly known as: TIMOPTIC USE 1 DROP(S) IN BOTH EYES ONCE IN THE MORNING FOR 30 DAYS    Past Medical History:  Diagnosis Date   Arthritis    Borderline diabetes    Chicken pox    Glaucoma    Heart murmur    Hypertension    Osteopenia    Seasonal allergies     Past Surgical History:  Procedure Laterality Date   BREAST BIOPSY Right 11/24/2017   rt breast stereo, benign   COLONOSCOPY  12/02/2006   TONSILLECTOMY AND ADENOIDECTOMY  1961    Review of systems negative except as noted in HPI / PMHx or noted below:  Review of Systems  Constitutional: Negative.   HENT: Negative.    Eyes: Negative.   Respiratory: Negative.    Cardiovascular: Negative.   Gastrointestinal: Negative.   Genitourinary: Negative.   Musculoskeletal: Negative.   Skin: Negative.   Neurological: Negative.   Endo/Heme/Allergies: Negative.   Psychiatric/Behavioral: Negative.       Objective:   Vitals:   05/08/24 0845  BP: 136/88  Pulse: 67  Resp: 14  Temp: 98.1 F (36.7 C)  SpO2: 97%   Height: 5' 3 (160 cm)  Weight: 162 lb 14.4 oz (73.9 kg)   Physical Exam Constitutional:      Appearance: She is not diaphoretic.  HENT:     Head: Normocephalic.     Right Ear: Tympanic membrane, ear canal and external ear  normal.     Left Ear: Tympanic membrane, ear canal and external ear normal.     Nose: Nose normal. No mucosal edema or rhinorrhea.     Mouth/Throat:     Pharynx: Uvula midline. No oropharyngeal exudate.   Eyes:     Conjunctiva/sclera: Conjunctivae normal.   Neck:     Thyroid : No thyromegaly.     Trachea: Trachea normal. No tracheal tenderness or tracheal deviation.   Cardiovascular:     Rate and Rhythm: Normal rate and regular rhythm.     Heart sounds: Normal heart sounds, S1 normal and S2 normal. No murmur heard. Pulmonary:     Effort: No respiratory distress.     Breath sounds: Normal breath sounds. No stridor. No wheezing or  rales.  Lymphadenopathy:     Head:     Right side of head: No tonsillar adenopathy.     Left side of head: No tonsillar adenopathy.     Cervical: No cervical adenopathy.   Skin:    Findings: No erythema or rash.     Nails: There is no clubbing.   Neurological:     Mental Status: She is alert.     Diagnostics: none  Assessment and Plan:   1. Perennial allergic rhinitis   2. Seasonal allergic rhinitis due to pollen   3. Seasonal allergic conjunctivitis   4. Dysfunction of both eustachian tubes   5. Chronic pansinusitis    1. Allergen avoidance measures - sports glasses  2. Never, ever rub / touch eyes  3. Treat and prevent inflammation of airway:   A. Ryaltris  - 2 sprays each nostril 1-2 times per day (SP)  4. If needed:   A. Cetirizine  10 mg - 1 tablet 1 time per day  B. Pataday  - 1 drop each eye 1` time per day  C. Gentle autoinflation of ears with positive pressure technique  5. Obtain sinus CT scan for chronic sinusitis  6. Obtain skin testing without antihistamine  7. Immunotherapy???   Madison [name] appears to have continued problems with her airway regarding congestion and inflammation and mucus production and I think we need to exclude the possibility that she may have a component of chronic sinusitis contributing to this issue especially given the fact that she has already had 2 antibiotics administered in 2025 yet still continues to have all of these issues and we will further define her aeroallergen hypersensitivity profile by skin testing her some point in near future.  Will get all that information together and make a decision about further evaluation and treatment based upon those results.  Madison Denis, MD Allergy / Immunology [facility]

## 2024-05-08 NOTE — Patient Instructions (Addendum)
  1. Allergen avoidance measures - sports glasses  2. Never, ever rub / touch eyes  3. Treat and prevent inflammation of airway:   A. Ryaltris  - 2 sprays each nostril 1-2 times per day (SP)  4. If needed:   A. Cetirizine  10 mg - 1 tablet 1 time per day  B. Pataday  - 1 drop each eye 1` time per day  C. Gentle autoinflation of ears with positive pressure technique  5. Obtain sinus CT scan for chronic sinusitis  6. Obtain skin testing without antihistamine  7. Immunotherapy???

## 2024-05-09 ENCOUNTER — Encounter: Payer: Self-pay | Admitting: Allergy and Immunology

## 2024-05-15 ENCOUNTER — Ambulatory Visit: Admitting: Allergy and Immunology

## 2024-05-15 DIAGNOSIS — H1010 Acute atopic conjunctivitis, unspecified eye: Secondary | ICD-10-CM

## 2024-05-15 DIAGNOSIS — J301 Allergic rhinitis due to pollen: Secondary | ICD-10-CM | POA: Diagnosis not present

## 2024-05-15 DIAGNOSIS — H1013 Acute atopic conjunctivitis, bilateral: Secondary | ICD-10-CM

## 2024-05-15 MED ORDER — RYALTRIS 665-25 MCG/ACT NA SUSP: 2.0000 | Freq: Two times a day (BID) | NASAL | 1 refills | Status: AC

## 2024-05-15 MED ORDER — OLOPATADINE HCL 0.2 % OP SOLN: 1.0000 [drp] | OPHTHALMIC | 1 refills | Status: AC

## 2024-05-15 MED ORDER — CETIRIZINE HCL 10 MG PO TABS: 10.0000 mg | ORAL_TABLET | Freq: Every day | ORAL | 1 refills | Status: AC | PRN

## 2024-05-16 NOTE — Progress Notes (Signed)
 Madison [name] returns to this clinic for skin testing.  Allergy skin testing identified hypersensitivity against tree, grass, weed pollen.  Allergen avoidance measures provided.

## 2024-05-17 ENCOUNTER — Telehealth: Payer: Self-pay | Admitting: Allergy and Immunology

## 2024-05-17 DIAGNOSIS — J324 Chronic pansinusitis: Secondary | ICD-10-CM

## 2024-05-17 NOTE — Telephone Encounter (Signed)
 Nandini called and stated that she was to have called and scheduled a scan of her sinuses per Dr. Maurilio. She states that she tried to schedule, and was told it was put in incorrectly, and would need to be re submitted.

## 2024-05-25 NOTE — Telephone Encounter (Signed)
 Pt informed that order has been corrected and she will call the radiology dept to schedule it. She will let us  know if she has any trouble setting this up.

## 2024-05-25 NOTE — Telephone Encounter (Signed)
 Looks like a CT Scanogram was ordered by mistake on 6/24. I will place order for Sinus CT. Cree- do you know if this needed to be with or without contrast?

## 2024-05-30 ENCOUNTER — Other Ambulatory Visit: Payer: Self-pay | Admitting: Allergy and Immunology

## 2024-05-30 DIAGNOSIS — J301 Allergic rhinitis due to pollen: Secondary | ICD-10-CM

## 2024-05-30 NOTE — Progress Notes (Signed)
 Aeroallergen Immunotherapy  Ordering Provider: Dr. [name]  Patient Details Name: Madison [name] MRN: [id number] Date of Birth: October 29, 1956  Order one of two  Vial Label: weed  0.2 ml (Volume)  1:10 Concentration -- Sheep Sorrell* 0.2 ml (Volume)  1:20 Concentration -- Mugwort, Common*   0.4  ml Extract Subtotal 4.6  ml Diluent  5.0  ml Maintenance Total  Blue Vial (1:100,000): Schedule B (6 doses) Yellow Vial (1:10,000): Schedule B (6 doses) Green Vial (1:1,000): Schedule B (6 doses) Red Vial (1:100): Schedule A (12 doses)  Special Instructions: 1-2 times per week

## 2024-05-30 NOTE — Progress Notes (Signed)
 Aeroallergen Immunotherapy   Ordering Provider: Dr. [name]   Patient Details  Name: Madison [name]  MRN: [id number]  Date of Birth: Jun 23, 1956   Order two of two   Vial Label: tree / grass   0.3 ml (Volume)  BAU Concentration -- 7 Grass Mix* 100,000 ([place]  Blue, [place], Orchard, Perennial Rye, RedTop, Sweet Vernal, Timothy)  0.2 ml (Volume)  1:20 Concentration -- Johnson  0.2 ml (Volume)  1:20 Concentration -- [place] American*  0.2 ml (Volume)  1:20 Concentration -- Box Elder  0.2 ml (Volume)  1:20 Concentration -- Red Mulberry    1.1  ml Extract Subtotal  3.9  ml Diluent   5.0  ml Maintenance Total   Blue Vial (1:100,000): Schedule B (6 doses)  Yellow Vial (1:10,000): Schedule B (6 doses)  Green Vial (1:1,000): Schedule B (6 doses)  Red Vial (1:100): Schedule A (12 doses)   Special Instructions: 1-2 times per week

## 2024-05-30 NOTE — Progress Notes (Signed)
 VIALS MADE 05-30-24

## 2024-05-31 DIAGNOSIS — J302 Other seasonal allergic rhinitis: Secondary | ICD-10-CM | POA: Diagnosis not present

## 2024-06-04 ENCOUNTER — Ambulatory Visit
Admission: RE | Admit: 2024-06-04 | Discharge: 2024-06-04 | Disposition: A | Discharge status: Home / Self Care | Source: Ambulatory Visit | Attending: Registered Nurse | Admitting: Registered Nurse

## 2024-06-04 DIAGNOSIS — Z1231 Encounter for screening mammogram for malignant neoplasm of breast: Secondary | ICD-10-CM

## 2024-06-15 ENCOUNTER — Ambulatory Visit

## 2024-06-15 DIAGNOSIS — J309 Allergic rhinitis, unspecified: Secondary | ICD-10-CM | POA: Diagnosis not present

## 2024-06-15 MED ORDER — EPINEPHRINE 0.3 MG/0.3ML IJ SOAJ: 0.3000 mg | INTRAMUSCULAR | 1 refills | Status: AC | PRN

## 2024-06-15 NOTE — Progress Notes (Signed)
 Immunotherapy   Patient Details  Name: Beula Joyner MRN: [id number] Date of Birth: 01/05/1956  06/15/2024  Rock Molt started injections for  TREE-GRASS and WEED exp 05/30/2025 Following schedule: B  Frequency:2 times per week Epi-Pen:Prescription for Epi-Pen given Consent signed and patient instructions given. Patient waited in office for 30 minutes without any issues.   Jalina Blowers 06/15/2024, 3:27 PM

## 2024-06-18 NOTE — Addendum Note (Signed)
 Addended by: CASIMIR CHIQUITA POUR on: 06/18/2024 03:28 PM   Modules accepted: Orders

## 2024-06-18 NOTE — Telephone Encounter (Signed)
 PA is NOT required. #[id number]

## 2024-06-18 NOTE — Telephone Encounter (Signed)
 Called [facility] scheduling and changed scan to maxillofacial ct without contrast. PA # [id number], expiration: 12/15/2024. Left detailed message on patient's voicemail, per DPR, reminding her of upcoming scan on Wednesday, August 6th at 8:00am, needing to check-in 15 minutes prior to appointment.  Left our telephone number in case she has any questions. Will send MyChart message as well.

## 2024-06-18 NOTE — Telephone Encounter (Signed)
 Imaging department called and PA was never obtained for Madison [name]'s sinus CT. Her appt is on the 6th. Submit PA as soon as possible. (Reminder, we do not schedule imaging services unless we have confirmation that a PA is required or not) Thanks!

## 2024-06-20 ENCOUNTER — Ambulatory Visit (HOSPITAL_COMMUNITY)

## 2024-06-20 ENCOUNTER — Ambulatory Visit (HOSPITAL_COMMUNITY)
Admission: RE | Admit: 2024-06-20 | Discharge: 2024-06-20 | Disposition: A | Discharge status: Home / Self Care | Source: Ambulatory Visit | Attending: Allergy and Immunology | Admitting: Allergy and Immunology

## 2024-06-20 DIAGNOSIS — J324 Chronic pansinusitis: Secondary | ICD-10-CM | POA: Diagnosis present

## 2024-06-26 ENCOUNTER — Ambulatory Visit (INDEPENDENT_AMBULATORY_CARE_PROVIDER_SITE_OTHER)

## 2024-06-26 DIAGNOSIS — J309 Allergic rhinitis, unspecified: Secondary | ICD-10-CM

## 2024-06-28 ENCOUNTER — Ambulatory Visit: Payer: Self-pay | Admitting: Allergy and Immunology

## 2024-07-04 ENCOUNTER — Other Ambulatory Visit (HOSPITAL_BASED_OUTPATIENT_CLINIC_OR_DEPARTMENT_OTHER): Payer: Self-pay | Admitting: Registered Nurse

## 2024-07-04 DIAGNOSIS — E2839 Other primary ovarian failure: Secondary | ICD-10-CM

## 2024-07-10 ENCOUNTER — Other Ambulatory Visit: Payer: Self-pay | Admitting: Registered Nurse

## 2024-07-10 ENCOUNTER — Ambulatory Visit
Admission: RE | Admit: 2024-07-10 | Discharge: 2024-07-10 | Disposition: A | Discharge status: Home / Self Care | Source: Ambulatory Visit | Attending: Registered Nurse | Admitting: Registered Nurse

## 2024-07-10 DIAGNOSIS — R109 Unspecified abdominal pain: Secondary | ICD-10-CM

## 2024-07-18 ENCOUNTER — Ambulatory Visit (INDEPENDENT_AMBULATORY_CARE_PROVIDER_SITE_OTHER)

## 2024-07-18 ENCOUNTER — Telehealth: Payer: Self-pay | Admitting: Allergy and Immunology

## 2024-07-18 DIAGNOSIS — J309 Allergic rhinitis, unspecified: Secondary | ICD-10-CM | POA: Diagnosis not present

## 2024-07-18 NOTE — Telephone Encounter (Signed)
 PT came in to get injection - asked about getting CT scan sent over to endodontist so they could review infection for appt on Tuesday. Reviewed with Rosaline who printed for PT and also faxed over to [facility] Endodontist - PT thanked

## 2024-08-02 ENCOUNTER — Ambulatory Visit (INDEPENDENT_AMBULATORY_CARE_PROVIDER_SITE_OTHER)

## 2024-08-02 DIAGNOSIS — J309 Allergic rhinitis, unspecified: Secondary | ICD-10-CM | POA: Diagnosis not present

## 2024-08-13 ENCOUNTER — Ambulatory Visit (INDEPENDENT_AMBULATORY_CARE_PROVIDER_SITE_OTHER)

## 2024-08-13 DIAGNOSIS — J309 Allergic rhinitis, unspecified: Secondary | ICD-10-CM

## 2024-08-27 ENCOUNTER — Ambulatory Visit (INDEPENDENT_AMBULATORY_CARE_PROVIDER_SITE_OTHER)

## 2024-08-27 DIAGNOSIS — J309 Allergic rhinitis, unspecified: Secondary | ICD-10-CM | POA: Diagnosis not present

## 2024-09-14 ENCOUNTER — Other Ambulatory Visit: Payer: Self-pay | Admitting: Primary Care

## 2024-09-14 DIAGNOSIS — E785 Hyperlipidemia, unspecified: Secondary | ICD-10-CM

## 2024-09-14 DIAGNOSIS — I10 Essential (primary) hypertension: Secondary | ICD-10-CM

## 2024-09-20 ENCOUNTER — Ambulatory Visit (INDEPENDENT_AMBULATORY_CARE_PROVIDER_SITE_OTHER)

## 2024-09-20 DIAGNOSIS — J309 Allergic rhinitis, unspecified: Secondary | ICD-10-CM | POA: Diagnosis not present

## 2024-10-18 ENCOUNTER — Ambulatory Visit

## 2024-10-18 DIAGNOSIS — J302 Other seasonal allergic rhinitis: Secondary | ICD-10-CM

## 2024-10-18 DIAGNOSIS — J309 Allergic rhinitis, unspecified: Secondary | ICD-10-CM

## 2024-10-30 ENCOUNTER — Ambulatory Visit

## 2024-10-30 DIAGNOSIS — J302 Other seasonal allergic rhinitis: Secondary | ICD-10-CM | POA: Diagnosis not present

## 2024-10-30 DIAGNOSIS — J309 Allergic rhinitis, unspecified: Secondary | ICD-10-CM

## 2024-11-04 NOTE — Patient Instructions (Signed)
 1. Allergen avoidance measures to pollens- sports glasses.  Continue allergy  injections and have access to EpiPen   2. Never, ever rub / touch eyes  3. Treat and prevent inflammation of airway:   A. Ryaltris  - 2 sprays each nostril 1-2 times per day   4. If needed:   A. Cetirizine  10 mg - 1 tablet 1 time per day  B. Pataday  - 1 drop each eye 1` time per day  C. Gentle autoinflation of ears with positive pressure technique  Call the clinic if this treatment plan is not working well for you.  Follow up in 1 year or sooner if needed.  Reducing Pollen Exposure The American Academy of Allergy , Asthma and Immunology suggests the following steps to reduce your exposure to pollen during allergy  seasons. Do not hang sheets or clothing out to dry; pollen may collect on these items. Do not mow lawns or spend time around freshly cut grass; mowing stirs up pollen. Keep windows closed at night.  Keep car windows closed while driving. Minimize morning activities outdoors, a time when pollen counts are usually at their highest. Stay indoors as much as possible when pollen counts or humidity is high and on windy days when pollen tends to remain in the air longer. Use air conditioning when possible.  Many air conditioners have filters that trap the pollen spores. Use a HEPA room air filter to remove pollen form the indoor air you breathe.

## 2024-11-04 NOTE — Progress Notes (Unsigned)
" ° °  [address] [place] [address] Dept: [phone]  FOLLOW UP NOTE  Patient ID: Madison [name], female    DOB: 1956-02-03  Age: 67 y.o. MRN: [id number] Date of Office Visit: 11/05/2024  Assessment  Chief Complaint: No chief complaint on file.  HPI Madison [name] is a 68 year old female who presents to the clinic for follow-up visit.  She was last seen in this clinic on 05/15/2024 for skin testing.  Prior to that visit, she was seen in this clinic on 05/08/2024 by Dr. Maurilio on 05/08/2024 for evaluation of allergic rhinitis, allergic conjunctivitis, eustachian tube dysfunction, and chronic pansinusitis. She began allergen immunotherapy directed toward grass pollen, weed pollen, and tree pollen on 06/15/2024.  Discussed the use of AI scribe software for clinical note transcription with the patient, who gave verbal consent to proceed.  History of Present Illness    Chart review:  EXAM: CT MAXILLOFACIAL WITHOUT CONTRAST   TECHNIQUE: Multidetector CT imaging of the maxillofacial structures was performed. Multiplanar CT image reconstructions were also generated.   RADIATION DOSE REDUCTION: This exam was performed according to the departmental dose-optimization program which includes automated exposure control, adjustment of the mA and/or kV according to patient size and/or use of iterative reconstruction technique.   COMPARISON:  None Available.   FINDINGS: Osseous: No fracture or mandibular dislocation. No destructive process.   Orbits: Negative. No traumatic or inflammatory finding.   Sinuses: Mostly clear sinuses. There is minimal mucosal thickening in the maxillary sinuses bilaterally. The sinus drainage pathways are patent. Mild leftward nasal septal deviation with bony spur. Left concha bullosa.   Soft tissues: Negative.   Limited intracranial: No significant or unexpected finding.   Other: Periapical lucency of the posterior-most right maxillary molar. No mastoid  effusions.   IMPRESSION: 1. Minimal paranasal sinus mucosal thickening of the maxillary sinuses. Otherwise, clear sinuses. The sinus drainage pathways are patent. 2. Periapical lucency of the posterior-most right maxillary molar.     Electronically Signed   By: Gilmore GORMAN [name] M.D.   On: 06/28/2024 15:08    Drug Allergies:  Allergies[1]  Physical Exam: There were no vitals taken for this visit.   Physical Exam  Diagnostics:    Assessment and Plan: No diagnosis found.  No orders of the defined types were placed in this encounter.   There are no Patient Instructions on file for this visit.  No follow-ups on file.    Thank you for the opportunity to care for this patient.  Please do not hesitate to contact me with questions.  Arlean Mutter, FNP [facility]          [1] No Known Allergies  "

## 2024-11-05 ENCOUNTER — Ambulatory Visit: Admitting: Family Medicine

## 2024-11-05 ENCOUNTER — Other Ambulatory Visit: Payer: Self-pay

## 2024-11-05 ENCOUNTER — Encounter: Payer: Self-pay | Admitting: Family Medicine

## 2024-11-05 ENCOUNTER — Ambulatory Visit

## 2024-11-05 VITALS — BP 132/60 | HR 68 | Temp 98.3°F

## 2024-11-05 DIAGNOSIS — H1010 Acute atopic conjunctivitis, unspecified eye: Secondary | ICD-10-CM | POA: Insufficient documentation

## 2024-11-05 DIAGNOSIS — H6993 Unspecified Eustachian tube disorder, bilateral: Secondary | ICD-10-CM | POA: Insufficient documentation

## 2024-11-05 DIAGNOSIS — J301 Allergic rhinitis due to pollen: Secondary | ICD-10-CM | POA: Insufficient documentation

## 2024-11-05 DIAGNOSIS — J309 Allergic rhinitis, unspecified: Secondary | ICD-10-CM

## 2024-11-05 DIAGNOSIS — J324 Chronic pansinusitis: Secondary | ICD-10-CM | POA: Insufficient documentation

## 2024-11-21 ENCOUNTER — Ambulatory Visit

## 2024-11-21 DIAGNOSIS — J302 Other seasonal allergic rhinitis: Secondary | ICD-10-CM

## 2024-12-04 ENCOUNTER — Ambulatory Visit

## 2024-12-04 DIAGNOSIS — J302 Other seasonal allergic rhinitis: Secondary | ICD-10-CM | POA: Diagnosis not present

## 2025-03-14 ENCOUNTER — Ambulatory Visit (HOSPITAL_BASED_OUTPATIENT_CLINIC_OR_DEPARTMENT_OTHER)
